# Patient Record
Sex: Male | Born: 1965 | Hispanic: Refuse to answer | Marital: Married | Smoking: Never smoker
Health system: Southern US, Community
[De-identification: ages and names within clinical notes are randomized; demographics above are authoritative.]

## PROBLEM LIST (undated history)

## (undated) DIAGNOSIS — Z87442 Personal history of urinary calculi: Secondary | ICD-10-CM

## (undated) DIAGNOSIS — M1990 Unspecified osteoarthritis, unspecified site: Secondary | ICD-10-CM

## (undated) DIAGNOSIS — K4090 Unilateral inguinal hernia, without obstruction or gangrene, not specified as recurrent: Secondary | ICD-10-CM

## (undated) DIAGNOSIS — L409 Psoriasis, unspecified: Secondary | ICD-10-CM

## (undated) DIAGNOSIS — S2249XA Multiple fractures of ribs, unspecified side, initial encounter for closed fracture: Secondary | ICD-10-CM

## (undated) DIAGNOSIS — S2239XA Fracture of one rib, unspecified side, initial encounter for closed fracture: Secondary | ICD-10-CM

## (undated) DIAGNOSIS — R112 Nausea with vomiting, unspecified: Secondary | ICD-10-CM

## (undated) DIAGNOSIS — K219 Gastro-esophageal reflux disease without esophagitis: Secondary | ICD-10-CM

## (undated) DIAGNOSIS — Z9889 Other specified postprocedural states: Secondary | ICD-10-CM

## (undated) DIAGNOSIS — J939 Pneumothorax, unspecified: Secondary | ICD-10-CM

## (undated) HISTORY — DX: Unspecified osteoarthritis, unspecified site: M19.90

## (undated) HISTORY — DX: Psoriasis, unspecified: L40.9

## (undated) HISTORY — PX: OTHER SURGICAL HISTORY: SHX169

## (undated) HISTORY — PX: APPENDECTOMY: SHX54

## (undated) HISTORY — PX: HERNIA REPAIR: SHX51

---

## 2003-12-11 ENCOUNTER — Emergency Department (HOSPITAL_COMMUNITY): Admission: EM | Admit: 2003-12-11 | Discharge: 2003-12-11 | Payer: Self-pay | Admitting: Emergency Medicine

## 2005-02-12 ENCOUNTER — Ambulatory Visit (HOSPITAL_BASED_OUTPATIENT_CLINIC_OR_DEPARTMENT_OTHER): Admission: RE | Admit: 2005-02-12 | Discharge: 2005-02-12 | Payer: Self-pay | Admitting: Orthopedic Surgery

## 2005-02-12 ENCOUNTER — Ambulatory Visit (HOSPITAL_COMMUNITY): Admission: RE | Admit: 2005-02-12 | Discharge: 2005-02-12 | Payer: Self-pay | Admitting: Orthopedic Surgery

## 2009-09-22 ENCOUNTER — Emergency Department (HOSPITAL_COMMUNITY): Admission: EM | Admit: 2009-09-22 | Discharge: 2009-09-23 | Payer: Self-pay | Admitting: Emergency Medicine

## 2009-09-29 ENCOUNTER — Emergency Department: Payer: Self-pay | Admitting: Emergency Medicine

## 2010-05-13 LAB — CBC
HCT: 46.5 % (ref 39.0–52.0)
Hemoglobin: 15.7 g/dL (ref 13.0–17.0)
MCH: 30.8 pg (ref 26.0–34.0)
MCHC: 33.8 g/dL (ref 30.0–36.0)
MCV: 91.3 fL (ref 78.0–100.0)
Platelets: 236 10*3/uL (ref 150–400)
RBC: 5.1 MIL/uL (ref 4.22–5.81)
RDW: 13 % (ref 11.5–15.5)
WBC: 16.1 10*3/uL — ABNORMAL HIGH (ref 4.0–10.5)

## 2010-05-13 LAB — POCT I-STAT, CHEM 8
BUN: 22 mg/dL (ref 6–23)
Calcium, Ion: 0.99 mmol/L — ABNORMAL LOW (ref 1.12–1.32)
Chloride: 109 mEq/L (ref 96–112)
Creatinine, Ser: 1.3 mg/dL (ref 0.4–1.5)
Glucose, Bld: 105 mg/dL — ABNORMAL HIGH (ref 70–99)
HCT: 47 % (ref 39.0–52.0)
Hemoglobin: 16 g/dL (ref 13.0–17.0)
Potassium: 3.7 mEq/L (ref 3.5–5.1)
Sodium: 143 mEq/L (ref 135–145)
TCO2: 23 mmol/L (ref 0–100)

## 2010-05-13 LAB — GLUCOSE, CAPILLARY: Glucose-Capillary: 103 mg/dL — ABNORMAL HIGH (ref 70–99)

## 2010-07-14 NOTE — Op Note (Signed)
NAMEFONTAINE, HEHL             ACCOUNT NO.:  [id number]   MEDICAL RECORD NO.:  [id number]          PATIENT TYPE:  AMB   LOCATION:  DSC                          FACILITY:  [facility]   PHYSICIAN:  Feliberto Gottron. Turner Daniels, M.D.   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   PROCEDURE:  Left coracoclavicular ligament reconstruction using a  semitendinosus graft loop through the clavicle and underneath the coracoid  process reinforced with two #5 FiberWires and a distal clavicle excision.   SURGEON:  Feliberto Gottron. Turner Daniels, M.D.   FIRST ASSISTANT:  None.   ANESTHETIC:  General endotracheal; interscalene block.   FLUID REPLACEMENT:  A liter of crystalloid.   DRAINS PLACED:  None.   TOURNIQUET TIME:  None.   ESTIMATED BLOOD LOSS:  150 mL   INDICATIONS FOR PROCEDURE:  [age]-year-old gentleman who fell off of  a horse 6 months ago and sustained a grade 3 AC separation, treated by  another physician appropriately and conservatively, who went on to develop  chronic pain and fatigue in his left shoulder.  Radiographs he brought to my  office were consistent with a chronic grade 3 AC separation and because of  pain and fatigue, he desired elective reconstruction on his second opinion.  This was discussed at length with him.  He is well aware of the risks and  benefits of surgery and all questions were answered.  We will attempt an  allograft reconstruction using a semitendinosus allograft which should be  about 4 times stronger than a traditional Weaver-Dunn procedure using the CA  ligament.   DESCRIPTION OF PROCEDURE:  The patient was identified by armband and taken  to the operating room at [facility].  Appropriate  anesthetic  monitors were attached and interscalene block anesthesia induced into the  left upper extremity.  This was followed by general endotracheal anesthesia.  He was then placed in the beach chair position and the left upper extremity  prepped and draped in the usual sterile fashion from the wrist to the  hemithorax.  We then made a saber-like incision over the [facility] joint following  Langer's lines from just posterior to the [facility] joint, over the [facility] joint and  then towards the coracoid process.  Small bleeders in the skin and  subcutaneous tissue were identified and cauterized.  We went down to the  fascia over the [facility] joint and clavicle and incised this longitudinally,  reflecting it anteriorly and posteriorly, exposing the distal clavicle and  the [facility] joint.  Once this had been completely exposed, we removed the distal  centimeter and half of the clavicle with the oscillating ACL saw and then  placed 2 quarter-inch holes in the clavicle just posterior to the midline  along the longitudinal axis from superior cortex through the inferior cortex  and these 2 tunnels were separated by about a  centimeter and a half of bone.  Satisfied with the clavicle preparation, we then smoothed off the edge of  the clavicle and directed our attention to passing a Cooley clamp underneath  the coracoid process and using this to pass a #2 Ethilon suture loop  underneath the coracoid.  Meanwhile on the back table, the semitendinosus  graft had been thawed and prepared with a #2 Ethibond whipstitch and then  using a suture passer, the #2 Ethibond whipstitch as well as two #5  FiberWires were brought from the inferior aspect of the distal clavicle hole  up through the superior aspect of that tunnel and then down through the  medial tunnel in the clavicle.  These loops were then passed underneath the  coracoid process, so in effect we had two #5 FiberWire suture loops and then  the allograft semitendinosus going up  through the clavicle, down through the  clavicle and underneath the coracoid process.  With an assistant applying  pressure on the elbow, the first #5 FiberWire loop was tied securely,  bringing the collar bone down against the posterior aspect of the coracoid  process, separated by only a couple of millimeters.  For reinforcement, a  second FiberWire loop was then tied and finally the semitendinosus tendon  loop, which was long enough, had 3 square knots placed in it, reinforced by  #1 Vicryl, and the excess semitendinosus tendon was then cut.  The wound was  then thoroughly irrigated out with normal saline solution.  The [facility] joint was  now reduced, fixed and reinforced.  We then repaired the deltotrapezius  fascia with #1 Vicryl, going over the clavicle and out over the acromion  process.  The subcutaneous tissue was then closed with 2-0 Vicryl suture and  the skin with running interlocking 3-0 nylon suture, a dressing of Xeroform,  4 x 4 dressing sponges, ABD and Hypafix tape applied followed by a sling.  The patient was laid supine, awakened and taken to the recovery room without  difficulty.      Feliberto Gottron. Turner Daniels, M.D.  Electronically Signed     FJR/MEDQ  D:  02/12/2005  T:  02/14/2005  Job:  [id number]

## 2011-03-28 ENCOUNTER — Ambulatory Visit (INDEPENDENT_AMBULATORY_CARE_PROVIDER_SITE_OTHER): Payer: BC Managed Care – PPO | Admitting: Ophthalmology

## 2011-03-28 DIAGNOSIS — H35419 Lattice degeneration of retina, unspecified eye: Secondary | ICD-10-CM

## 2011-03-28 DIAGNOSIS — H43819 Vitreous degeneration, unspecified eye: Secondary | ICD-10-CM

## 2011-03-28 DIAGNOSIS — H33309 Unspecified retinal break, unspecified eye: Secondary | ICD-10-CM

## 2011-04-06 ENCOUNTER — Encounter (INDEPENDENT_AMBULATORY_CARE_PROVIDER_SITE_OTHER): Payer: BC Managed Care – PPO | Admitting: Ophthalmology

## 2011-04-06 DIAGNOSIS — H33309 Unspecified retinal break, unspecified eye: Secondary | ICD-10-CM

## 2011-04-11 ENCOUNTER — Encounter (INDEPENDENT_AMBULATORY_CARE_PROVIDER_SITE_OTHER): Payer: BC Managed Care – PPO | Admitting: Ophthalmology

## 2011-04-11 DIAGNOSIS — H33309 Unspecified retinal break, unspecified eye: Secondary | ICD-10-CM

## 2011-04-26 ENCOUNTER — Ambulatory Visit (INDEPENDENT_AMBULATORY_CARE_PROVIDER_SITE_OTHER): Payer: BC Managed Care – PPO | Admitting: Ophthalmology

## 2011-04-26 DIAGNOSIS — H33309 Unspecified retinal break, unspecified eye: Secondary | ICD-10-CM

## 2011-06-05 ENCOUNTER — Other Ambulatory Visit: Payer: Self-pay | Admitting: Orthopedic Surgery

## 2011-06-14 ENCOUNTER — Encounter (HOSPITAL_BASED_OUTPATIENT_CLINIC_OR_DEPARTMENT_OTHER): Admission: RE | Payer: Self-pay | Source: Ambulatory Visit

## 2011-06-14 ENCOUNTER — Ambulatory Visit (HOSPITAL_BASED_OUTPATIENT_CLINIC_OR_DEPARTMENT_OTHER)
Admission: RE | Admit: 2011-06-14 | Payer: BC Managed Care – PPO | Source: Ambulatory Visit | Admitting: Orthopedic Surgery

## 2011-06-14 SURGERY — SHOULDER ACROMIOPLASTY
Anesthesia: General | Site: Shoulder | Laterality: Right

## 2011-08-24 ENCOUNTER — Ambulatory Visit (INDEPENDENT_AMBULATORY_CARE_PROVIDER_SITE_OTHER): Payer: BC Managed Care – PPO | Admitting: Ophthalmology

## 2011-12-20 ENCOUNTER — Other Ambulatory Visit: Payer: Self-pay | Admitting: Orthopaedic Surgery

## 2011-12-20 DIAGNOSIS — M549 Dorsalgia, unspecified: Secondary | ICD-10-CM

## 2011-12-21 ENCOUNTER — Other Ambulatory Visit: Payer: Self-pay | Admitting: Orthopaedic Surgery

## 2011-12-21 DIAGNOSIS — M549 Dorsalgia, unspecified: Secondary | ICD-10-CM

## 2011-12-26 ENCOUNTER — Ambulatory Visit
Admission: RE | Admit: 2011-12-26 | Discharge: 2011-12-26 | Disposition: A | Discharge status: Home / Self Care | Payer: BC Managed Care – PPO | Source: Ambulatory Visit | Attending: Orthopaedic Surgery | Admitting: Orthopaedic Surgery

## 2011-12-26 DIAGNOSIS — M549 Dorsalgia, unspecified: Secondary | ICD-10-CM

## 2013-06-04 ENCOUNTER — Ambulatory Visit: Payer: Self-pay | Admitting: Physician Assistant

## 2013-11-26 DIAGNOSIS — J939 Pneumothorax, unspecified: Secondary | ICD-10-CM

## 2013-11-26 HISTORY — DX: Pneumothorax, unspecified: J93.9

## 2013-12-04 ENCOUNTER — Ambulatory Visit: Payer: Self-pay | Admitting: Family Medicine

## 2013-12-04 LAB — COMPREHENSIVE METABOLIC PANEL
Albumin: 4.3 g/dL (ref 3.4–5.0)
Alkaline Phosphatase: 56 U/L
Anion Gap: 9 (ref 7–16)
BUN: 19 mg/dL — ABNORMAL HIGH (ref 7–18)
Bilirubin,Total: 0.4 mg/dL (ref 0.2–1.0)
Calcium, Total: 9 mg/dL (ref 8.5–10.1)
Chloride: 108 mmol/L — ABNORMAL HIGH (ref 98–107)
Co2: 27 mmol/L (ref 21–32)
Creatinine: 1.5 mg/dL — ABNORMAL HIGH (ref 0.60–1.30)
EGFR (African American): >60
EGFR (Non-African Amer.): 54 — ABNORMAL LOW
Glucose: 120 mg/dL — ABNORMAL HIGH (ref 65–99)
Osmolality: 290 (ref 275–301)
Potassium: 3.9 mmol/L (ref 3.5–5.1)
SGOT(AST): 119 U/L — ABNORMAL HIGH (ref 15–37)
SGPT (ALT): 126 U/L — ABNORMAL HIGH
Sodium: 144 mmol/L (ref 136–145)
Total Protein: 7.4 g/dL (ref 6.4–8.2)

## 2013-12-04 LAB — CBC WITH DIFFERENTIAL/PLATELET
Basophil #: 0.1 10*3/uL (ref 0.0–0.1)
Basophil %: 0.5 %
Eosinophil #: 0 10*3/uL (ref 0.0–0.7)
Eosinophil %: 0.3 %
HCT: 43.1 % (ref 40.0–52.0)
HGB: 14.2 g/dL (ref 13.0–18.0)
Lymphocyte #: 1.5 10*3/uL (ref 1.0–3.6)
Lymphocyte %: 11.7 %
MCH: 29.6 pg (ref 26.0–34.0)
MCHC: 32.9 g/dL (ref 32.0–36.0)
MCV: 90 fL (ref 80–100)
Monocyte #: 0.8 x10 3/mm (ref 0.2–1.0)
Monocyte %: 6.5 %
Neutrophil #: 10.1 10*3/uL — ABNORMAL HIGH (ref 1.4–6.5)
Neutrophil %: 81 %
Platelet: 294 10*3/uL (ref 150–440)
RBC: 4.79 10*6/uL (ref 4.40–5.90)
RDW: 13.4 % (ref 11.5–14.5)
WBC: 12.4 10*3/uL — ABNORMAL HIGH (ref 3.8–10.6)

## 2013-12-05 ENCOUNTER — Inpatient Hospital Stay: Payer: Self-pay | Admitting: Surgery

## 2013-12-08 LAB — PLATELET COUNT: Platelet: 224 10*3/uL (ref 150–440)

## 2013-12-17 ENCOUNTER — Ambulatory Visit: Payer: Self-pay | Admitting: Cardiothoracic Surgery

## 2013-12-27 ENCOUNTER — Ambulatory Visit: Payer: Self-pay | Admitting: Cardiothoracic Surgery

## 2014-02-24 ENCOUNTER — Ambulatory Visit: Payer: Self-pay | Admitting: Adult Health
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
## 2015-04-11 IMAGING — CR DG CHEST 1V PORT
1 series · 1 of 1 positions shown · non-contrast
Comparison: Chest x-ray 12/04/2013.

CLINICAL DATA: Chest tube placement.

EXAM:
PORTABLE CHEST - 1 VIEW

[ap]
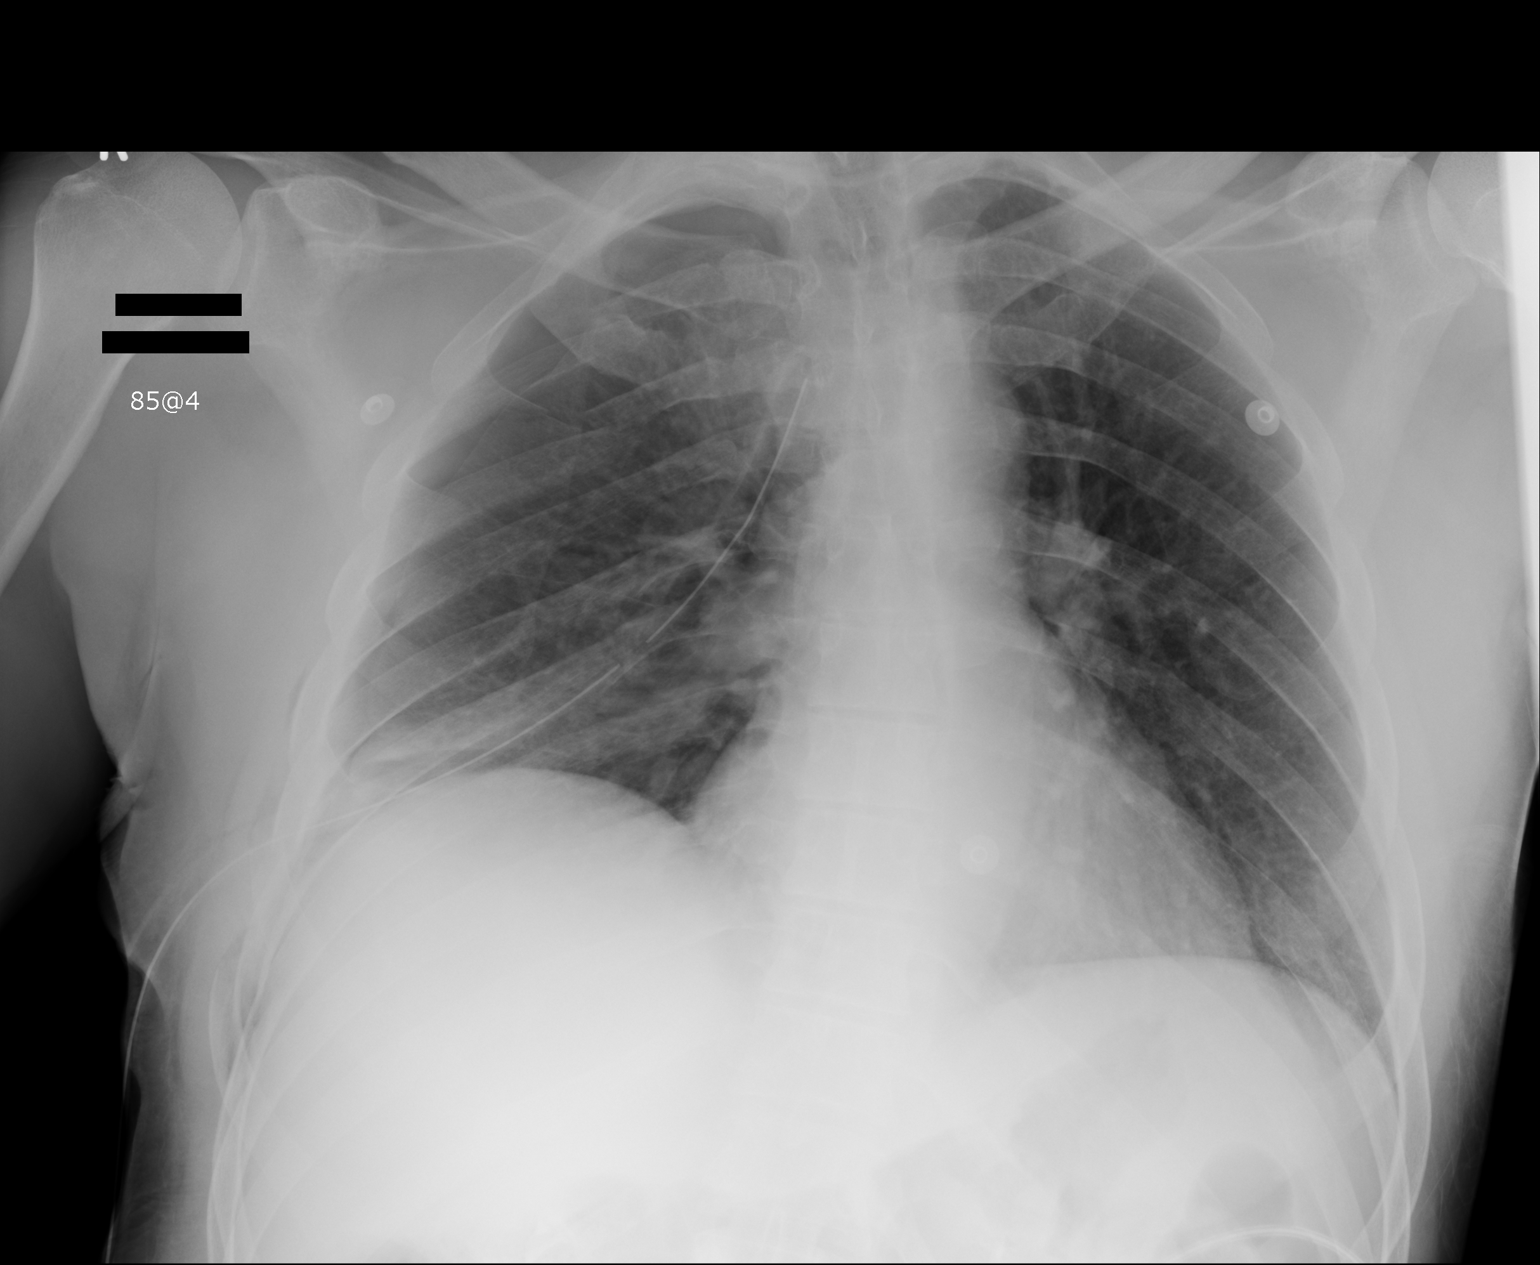

[1 of 1 positions shown; findings below may reference images not displayed]

FINDINGS: Right-sided chest tube in good anatomic position. Residual right
pneumothorax noted. Mild subcutaneous emphysema right chest wall.
Mediastinum an hilar structures are normal. Heart size normal. Mild
right base atelectasis. Multiple displaced rib fractures on the
right are noted.
IMPRESSION: 1. Right chest tube noted in good anatomic position. Residual right
pneumothorax noted.
2. Subsegmental atelectasis right lung base.
3. Displaced right rib fractures noted.

## 2015-04-12 IMAGING — CR DG CHEST 1V PORT
1 series · 1 of 1 positions shown · non-contrast
Comparison: 12/05/2013

CLINICAL DATA: Follow-up right pneumothorax

EXAM:
PORTABLE CHEST - 1 VIEW

[ap]
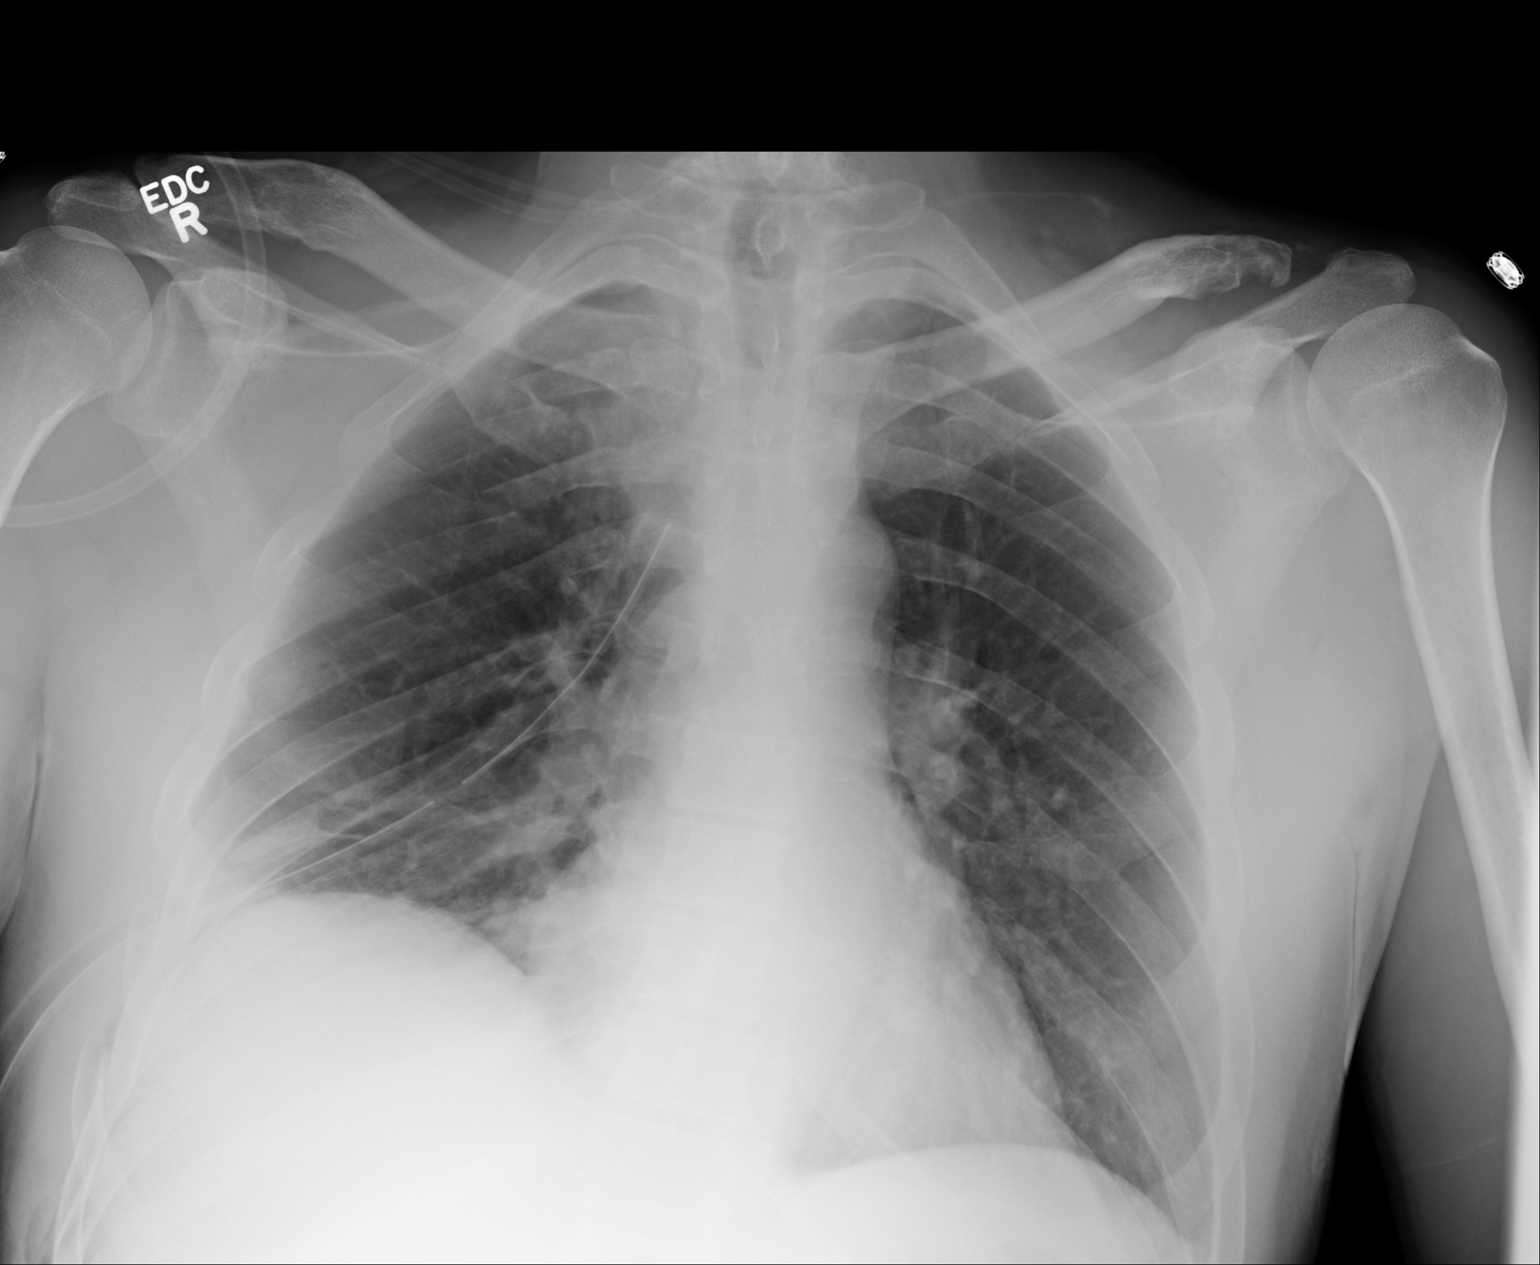

[1 of 1 positions shown; findings below may reference images not displayed]

FINDINGS: Cardiomediastinal silhouette is stable. Stable right chest tube
position. Stable right upper rib fractures. Improvement in right
pneumothorax. Only tiny residual right apical pneumothorax. No acute
infiltrate or pulmonary edema.
IMPRESSION: Stable right chest tube position. Only tiny residual right apical
pneumothorax. No acute infiltrate or pulmonary edema.

## 2015-11-24 ENCOUNTER — Encounter (INDEPENDENT_AMBULATORY_CARE_PROVIDER_SITE_OTHER): Payer: BLUE CROSS/BLUE SHIELD | Admitting: Ophthalmology

## 2015-11-24 DIAGNOSIS — H33303 Unspecified retinal break, bilateral: Secondary | ICD-10-CM | POA: Diagnosis not present

## 2015-11-24 DIAGNOSIS — H43813 Vitreous degeneration, bilateral: Secondary | ICD-10-CM | POA: Diagnosis not present

## 2016-06-25 ENCOUNTER — Other Ambulatory Visit: Payer: Self-pay | Admitting: Family Medicine

## 2016-06-25 DIAGNOSIS — R109 Unspecified abdominal pain: Secondary | ICD-10-CM

## 2016-06-28 ENCOUNTER — Ambulatory Visit
Admission: RE | Admit: 2016-06-28 | Discharge: 2016-06-28 | Disposition: A | Discharge status: Home / Self Care | Payer: BLUE CROSS/BLUE SHIELD | Source: Ambulatory Visit | Attending: Family Medicine | Admitting: Family Medicine

## 2016-06-28 DIAGNOSIS — N132 Hydronephrosis with renal and ureteral calculous obstruction: Secondary | ICD-10-CM | POA: Insufficient documentation

## 2016-06-28 DIAGNOSIS — R109 Unspecified abdominal pain: Secondary | ICD-10-CM | POA: Diagnosis present

## 2016-07-03 ENCOUNTER — Encounter (HOSPITAL_COMMUNITY): Payer: Self-pay | Admitting: *Deleted

## 2016-07-03 ENCOUNTER — Other Ambulatory Visit: Payer: Self-pay | Admitting: Urology

## 2016-07-04 ENCOUNTER — Ambulatory Visit (HOSPITAL_COMMUNITY): Payer: BLUE CROSS/BLUE SHIELD | Admitting: Certified Registered Nurse Anesthetist

## 2016-07-04 ENCOUNTER — Encounter (HOSPITAL_COMMUNITY)
Admission: RE | Disposition: A | Discharge status: Home / Self Care | Payer: Self-pay | Source: Ambulatory Visit | Attending: Urology

## 2016-07-04 ENCOUNTER — Ambulatory Visit (HOSPITAL_COMMUNITY)
Admission: RE | Admit: 2016-07-04 | Discharge: 2016-07-04 | Disposition: A | Discharge status: Home / Self Care | Payer: BLUE CROSS/BLUE SHIELD | Source: Ambulatory Visit | Attending: Urology | Admitting: Urology

## 2016-07-04 ENCOUNTER — Ambulatory Visit (HOSPITAL_COMMUNITY): Payer: BLUE CROSS/BLUE SHIELD

## 2016-07-04 ENCOUNTER — Encounter (HOSPITAL_COMMUNITY): Payer: Self-pay | Admitting: *Deleted

## 2016-07-04 DIAGNOSIS — Z87442 Personal history of urinary calculi: Secondary | ICD-10-CM | POA: Insufficient documentation

## 2016-07-04 DIAGNOSIS — N132 Hydronephrosis with renal and ureteral calculous obstruction: Secondary | ICD-10-CM | POA: Diagnosis present

## 2016-07-04 HISTORY — DX: Nausea with vomiting, unspecified: R11.2

## 2016-07-04 HISTORY — PX: CYSTOSCOPY WITH RETROGRADE PYELOGRAM, URETEROSCOPY AND STENT PLACEMENT: SHX5789

## 2016-07-04 HISTORY — DX: Pneumothorax, unspecified: J93.9

## 2016-07-04 HISTORY — DX: Personal history of urinary calculi: Z87.442

## 2016-07-04 HISTORY — DX: Other specified postprocedural states: Z98.890

## 2016-07-04 HISTORY — PX: HOLMIUM LASER APPLICATION: SHX5852

## 2016-07-04 LAB — BASIC METABOLIC PANEL
Anion gap: 10 (ref 5–15)
BUN: 22 mg/dL — ABNORMAL HIGH (ref 6–20)
CALCIUM: 8.7 mg/dL — AB (ref 8.9–10.3)
CO2: 27 mmol/L (ref 22–32)
CREATININE: 1.85 mg/dL — AB (ref 0.61–1.24)
Chloride: 102 mmol/L (ref 101–111)
GFR, EST AFRICAN AMERICAN: 48 mL/min — AB (ref >60)
GFR, EST NON AFRICAN AMERICAN: 41 mL/min — AB (ref >60)
GLUCOSE: 95 mg/dL (ref 65–99)
Potassium: 3.7 mmol/L (ref 3.5–5.1)
Sodium: 139 mmol/L (ref 135–145)

## 2016-07-04 LAB — CBC
HCT: 36.9 % — ABNORMAL LOW (ref 39.0–52.0)
Hemoglobin: 12.3 g/dL — ABNORMAL LOW (ref 13.0–17.0)
MCH: 29.4 pg (ref 26.0–34.0)
MCHC: 33.3 g/dL (ref 30.0–36.0)
MCV: 88.3 fL (ref 78.0–100.0)
PLATELETS: 350 10*3/uL (ref 150–400)
RBC: 4.18 MIL/uL — AB (ref 4.22–5.81)
RDW: 12.7 % (ref 11.5–15.5)
WBC: 13.2 10*3/uL — AB (ref 4.0–10.5)

## 2016-07-04 SURGERY — CYSTOURETEROSCOPY, WITH RETROGRADE PYELOGRAM AND STENT INSERTION
Anesthesia: General | Site: Ureter | Laterality: Right

## 2016-07-04 MED ORDER — FENTANYL CITRATE (PF) 100 MCG/2ML IJ SOLN
INTRAMUSCULAR | Status: AC
  Filled 2016-07-04: qty 2

## 2016-07-04 MED ORDER — PROPOFOL 10 MG/ML IV BOLUS
INTRAVENOUS | Status: AC
  Filled 2016-07-04: qty 20

## 2016-07-04 MED ORDER — LIDOCAINE 2% (20 MG/ML) 5 ML SYRINGE
INTRAMUSCULAR | Status: AC
  Filled 2016-07-04: qty 5

## 2016-07-04 MED ORDER — ONDANSETRON HCL 4 MG/2ML IJ SOLN
INTRAMUSCULAR | Status: DC | PRN
  Administered 2016-07-04: 4 mg via INTRAVENOUS

## 2016-07-04 MED ORDER — OXYCODONE HCL 5 MG PO TABS: 5.0000 mg | ORAL_TABLET | Freq: Once | ORAL | Status: DC | PRN

## 2016-07-04 MED ORDER — ROCURONIUM BROMIDE 50 MG/5ML IV SOSY
PREFILLED_SYRINGE | INTRAVENOUS | Status: DC | PRN
  Administered 2016-07-04: 30 mg via INTRAVENOUS

## 2016-07-04 MED ORDER — SUCCINYLCHOLINE CHLORIDE 200 MG/10ML IV SOSY
PREFILLED_SYRINGE | INTRAVENOUS | Status: DC | PRN
  Administered 2016-07-04: 140 mg via INTRAVENOUS

## 2016-07-04 MED ORDER — SUGAMMADEX SODIUM 200 MG/2ML IV SOLN
INTRAVENOUS | Status: DC | PRN
  Administered 2016-07-04: 200 mg via INTRAVENOUS

## 2016-07-04 MED ORDER — MIDAZOLAM HCL 2 MG/2ML IJ SOLN
INTRAMUSCULAR | Status: AC
  Filled 2016-07-04: qty 2

## 2016-07-04 MED ORDER — ACETAMINOPHEN 10 MG/ML IV SOLN
INTRAVENOUS | Status: AC
  Administered 2016-07-04: 1000 mg via INTRAVENOUS
  Filled 2016-07-04: qty 100

## 2016-07-04 MED ORDER — ROCURONIUM BROMIDE 50 MG/5ML IV SOSY
PREFILLED_SYRINGE | INTRAVENOUS | Status: AC
Start: 2016-07-04 — End: 2016-07-04
  Filled 2016-07-04: qty 5

## 2016-07-04 MED ORDER — SENNOSIDES-DOCUSATE SODIUM 8.6-50 MG PO TABS: 1.0000 | ORAL_TABLET | Freq: Two times a day (BID) | ORAL | 0 refills | Status: DC

## 2016-07-04 MED ORDER — DEXAMETHASONE SODIUM PHOSPHATE 10 MG/ML IJ SOLN
INTRAMUSCULAR | Status: DC | PRN
  Administered 2016-07-04: 10 mg via INTRAVENOUS

## 2016-07-04 MED ORDER — LACTATED RINGERS IV SOLN
INTRAVENOUS | Status: DC
  Administered 2016-07-04 (×2): via INTRAVENOUS

## 2016-07-04 MED ORDER — KETOROLAC TROMETHAMINE 10 MG PO TABS: 10.0000 mg | ORAL_TABLET | Freq: Four times a day (QID) | ORAL | 1 refills | Status: DC | PRN

## 2016-07-04 MED ORDER — SODIUM CHLORIDE 0.9 % IR SOLN
Status: DC | PRN
  Administered 2016-07-04: 3000 mL

## 2016-07-04 MED ORDER — DEXAMETHASONE SODIUM PHOSPHATE 10 MG/ML IJ SOLN
INTRAMUSCULAR | Status: AC
  Filled 2016-07-04: qty 1

## 2016-07-04 MED ORDER — SULFAMETHOXAZOLE-TRIMETHOPRIM 800-160 MG PO TABS: 1.0000 | ORAL_TABLET | Freq: Two times a day (BID) | ORAL | 0 refills | Status: DC

## 2016-07-04 MED ORDER — 0.9 % SODIUM CHLORIDE (POUR BTL) OPTIME
TOPICAL | Status: DC | PRN
  Administered 2016-07-04: 1000 mL

## 2016-07-04 MED ORDER — SODIUM CHLORIDE 0.9 % IR SOLN
Status: DC | PRN
  Administered 2016-07-04: 2000 mL

## 2016-07-04 MED ORDER — MEPERIDINE HCL 50 MG/ML IJ SOLN
6.2500 mg | INTRAMUSCULAR | Status: DC | PRN
  Administered 2016-07-04: 12.5 mg via INTRAVENOUS

## 2016-07-04 MED ORDER — IOHEXOL 300 MG/ML  SOLN
INTRAMUSCULAR | Status: DC | PRN
Start: 2016-07-04 — End: 2016-07-04
  Administered 2016-07-04: 14 mL

## 2016-07-04 MED ORDER — HYDROMORPHONE HCL 1 MG/ML IJ SOLN: 0.2500 mg | INTRAMUSCULAR | Status: DC | PRN

## 2016-07-04 MED ORDER — FENTANYL CITRATE (PF) 100 MCG/2ML IJ SOLN
INTRAMUSCULAR | Status: DC | PRN
  Administered 2016-07-04 (×4): 50 ug via INTRAVENOUS

## 2016-07-04 MED ORDER — PROPOFOL 10 MG/ML IV BOLUS
INTRAVENOUS | Status: DC | PRN
  Administered 2016-07-04: 200 mg via INTRAVENOUS

## 2016-07-04 MED ORDER — OXYCODONE HCL 5 MG/5ML PO SOLN: 5.0000 mg | Freq: Once | ORAL | Status: DC | PRN

## 2016-07-04 MED ORDER — CEFAZOLIN SODIUM-DEXTROSE 2-4 GM/100ML-% IV SOLN
2.0000 g | INTRAVENOUS | Status: AC
  Administered 2016-07-04: 2 g via INTRAVENOUS
  Filled 2016-07-04: qty 100

## 2016-07-04 MED ORDER — ACETAMINOPHEN 10 MG/ML IV SOLN
1000.0000 mg | Freq: Once | INTRAVENOUS | Status: AC
  Administered 2016-07-04: 1000 mg via INTRAVENOUS

## 2016-07-04 MED ORDER — HYDROCODONE-ACETAMINOPHEN 5-325 MG PO TABS: 1.0000 | ORAL_TABLET | Freq: Four times a day (QID) | ORAL | 0 refills | Status: DC | PRN

## 2016-07-04 MED ORDER — LIDOCAINE 2% (20 MG/ML) 5 ML SYRINGE
INTRAMUSCULAR | Status: DC | PRN
  Administered 2016-07-04: 100 mg via INTRAVENOUS

## 2016-07-04 MED ORDER — SUCCINYLCHOLINE CHLORIDE 200 MG/10ML IV SOSY
PREFILLED_SYRINGE | INTRAVENOUS | Status: AC
  Filled 2016-07-04: qty 10

## 2016-07-04 MED ORDER — MEPERIDINE HCL 50 MG/ML IJ SOLN
INTRAMUSCULAR | Status: AC
  Administered 2016-07-04: 12.5 mg via INTRAVENOUS
  Filled 2016-07-04: qty 1

## 2016-07-04 MED ORDER — PROMETHAZINE HCL 25 MG/ML IJ SOLN: 6.2500 mg | INTRAMUSCULAR | Status: DC | PRN

## 2016-07-04 MED ORDER — ONDANSETRON HCL 4 MG/2ML IJ SOLN
INTRAMUSCULAR | Status: AC
  Filled 2016-07-04: qty 2

## 2016-07-04 SURGICAL SUPPLY — 30 items
BAG URO CATCHER STRL LF (MISCELLANEOUS) ×4 IMPLANT
BASKET LASER NITINOL 1.9FR (BASKET) IMPLANT
BASKET ZERO TIP 1.9FR (BASKET) ×4 IMPLANT
CATH INTERMIT  6FR 70CM (CATHETERS) ×4 IMPLANT
CLOTH BEACON ORANGE TIMEOUT ST (SAFETY) ×4 IMPLANT
COVER SURGICAL LIGHT HANDLE (MISCELLANEOUS) IMPLANT
FIBER LASER FLEXIVA 1000 (UROLOGICAL SUPPLIES) IMPLANT
FIBER LASER FLEXIVA 365 (UROLOGICAL SUPPLIES) IMPLANT
FIBER LASER FLEXIVA 550 (UROLOGICAL SUPPLIES) IMPLANT
FIBER LASER TRAC TIP (UROLOGICAL SUPPLIES) ×4 IMPLANT
GLOVE BIOGEL M STRL SZ7.5 (GLOVE) ×4 IMPLANT
GLOVE BIOGEL PI IND STRL 7.0 (GLOVE) ×2 IMPLANT
GLOVE BIOGEL PI INDICATOR 7.0 (GLOVE) ×2
GLOVE SURG SS PI 6.5 STRL IVOR (GLOVE) ×4 IMPLANT
GOWN STRL REUS W/TWL LRG LVL3 (GOWN DISPOSABLE) ×8 IMPLANT
GUIDEWIRE ANG ZIPWIRE 038X150 (WIRE) ×4 IMPLANT
GUIDEWIRE STR DUAL SENSOR (WIRE) ×4 IMPLANT
IV NS 1000ML (IV SOLUTION) ×4
IV NS 1000ML BAXH (IV SOLUTION) ×4 IMPLANT
IV NS IRRIG 3000ML ARTHROMATIC (IV SOLUTION) ×4 IMPLANT
MANIFOLD NEPTUNE II (INSTRUMENTS) ×4 IMPLANT
PACK CYSTO (CUSTOM PROCEDURE TRAY) ×4 IMPLANT
SHEATH ACCESS URETERAL 24CM (SHEATH) IMPLANT
SHEATH ACCESS URETERAL 38CM (SHEATH) ×4 IMPLANT
SHEATH ACCESS URETERAL 54CM (SHEATH) IMPLANT
STENT POLARIS LOOP 6FR X 24 CM (STENTS) ×4 IMPLANT
SYR CONTROL 10ML LL (SYRINGE) IMPLANT
TUBE FEEDING 8FR 16IN STR KANG (MISCELLANEOUS) ×4 IMPLANT
TUBING CONNECTING 10 (TUBING) ×3 IMPLANT
TUBING CONNECTING 10' (TUBING) ×1

## 2016-07-04 NOTE — Brief Op Note (Signed)
07/04/2016  5:28 PM  PATIENT:  Bryce Medina  51 y.o. male  PRE-OPERATIVE DIAGNOSIS:  RIGHT URETERAL / RENAL STONESRIGHT URETERAL / RENAL STONES  POST-OPERATIVE DIAGNOSIS:  RIGHT URETERAL / RENAL STONES  PROCEDURE:  Procedure(s): CYSTOSCOPY WITH RETROGRADE PYELOGRAM, URETEROSCOPY AND STENT PLACEMENT (N/A) HOLMIUM LASER APPLICATION (Right)  SURGEON:  Surgeon(s) and Role:    Alexis Frock, MD - Primary  PHYSICIAN ASSISTANT:   ASSISTANTS: none   ANESTHESIA:   general  EBL:  Total I/O In: 1000 [I.V.:1000] Out: -   BLOOD ADMINISTERED:none  DRAINS: none   LOCAL MEDICATIONS USED:  NONE  SPECIMEN:  Source of Specimen:  Rt ureteral / renal stones  DISPOSITION OF SPECIMEN:  [facility] for compositional analysis  COUNTS:  YES  TOURNIQUET:  * No tourniquets in log *  DICTATION: .Other Dictation: Dictation Number [id number]  PLAN OF CARE: Discharge to home after PACU  PATIENT DISPOSITION:  PACU - hemodynamically stable.   Delay start of Pharmacological VTE agent (>24hrs) due to surgical blood loss or risk of bleeding: yes

## 2016-07-04 NOTE — H&P (Signed)
Bryce Medina is an 51 y.o. male.    Chief Complaint: Pre-op RIGHT Ureteroscopic Stone Manipulation  HPI:   1 - Recurrent Nephrolithiasis -  Pre 2018 - medical passage x "lost count"  06/2016 - Rt 3mm mid ureteral stone (L4-5 interspace, 900 HU, SSD 11cm) with mild hydro + 37mm Rt intrarenal stone x 4 and Lt 56mm intrarenal stone x 2 by CT on eval flank pain. UA without infectious parameters.   PMH sig for Lt inguinal hernia repair / mesh, appy, ortho surgery. NO CV disease / blood thinners. His PCP is Bryce Medina with Bryce Medina.   Today " Bryce Medina" is seen to proceed with RIGHT ureteroscopic stone manipulation with goal of right side stone free. Most recent UA without infectious parameters. He has had a mild cough and congestion but no dysuria.    Past Medical History:  Diagnosis Date  . History of kidney stones   . PONV (postoperative nausea and vomiting)     Past Surgical History:  Procedure Laterality Date  . APPENDECTOMY    . HERNIA REPAIR     left inguinla hernia repair   . left shoulder surgery       History reviewed. No pertinent family history. Social History:  reports that he has never smoked. He has never used smokeless tobacco. He reports that he does not drink alcohol or use drugs.  Allergies: Not on File  No prescriptions prior to admission.    No results found for this or any previous visit (from the past 48 hour(s)). No results found.  Review of Systems  Constitutional: Negative.  Negative for chills and fever.  HENT: Negative.   Eyes: Negative.   Respiratory: Negative.   Cardiovascular: Negative.   Gastrointestinal: Negative.   Genitourinary: Positive for flank pain.  Skin: Negative.   Neurological: Negative.   Endo/Heme/Allergies: Negative.   Psychiatric/Behavioral: Negative.     There were no vitals taken for this visit. Physical Exam  Constitutional: He appears well-developed.  HENT:  Head: Normocephalic.  Eyes: Pupils are equal, round, and  reactive to light.  Neck: Normal range of motion.  Cardiovascular: Normal rate.   Respiratory: Effort normal.  GI: Soft.  Genitourinary:  Genitourinary Comments: Mild Rt CVAT at present  Musculoskeletal: Normal range of motion.  Neurological: He is alert.  Skin: Skin is warm.     Assessment/Plan  Proceed as planned with RIGHT Ureteroscopic stone manipulation. Risks, benefits, alternatives, expected peri-op course, need for temporary peri-op stent discussed previously and reiterated today.   Alexis Frock, MD 07/04/2016, 7:19 AM

## 2016-07-04 NOTE — Anesthesia Postprocedure Evaluation (Signed)
Anesthesia Post Note  Patient: Bryce Medina  Procedure(s) Performed: Procedure(s) (LRB): CYSTOSCOPY WITH RETROGRADE PYELOGRAM, URETEROSCOPY AND STENT PLACEMENT (N/A) HOLMIUM LASER APPLICATION (Right)  Patient location during evaluation: PACU Anesthesia Type: General Level of consciousness: awake and alert Pain management: pain level controlled Vital Signs Assessment: post-procedure vital signs reviewed and stable Respiratory status: spontaneous breathing, nonlabored ventilation and respiratory function stable Cardiovascular status: blood pressure returned to baseline and stable Postop Assessment: no signs of nausea or vomiting Anesthetic complications: no       Last Vitals:  Vitals:   07/04/16 1815 07/04/16 1822  BP: (!) 149/93 130/84  Pulse: 98 97  Resp: 17 16  Temp: 37.6 C 37.6 C    Last Pain:  Vitals:   07/04/16 1815  TempSrc:   PainSc: 0-No pain                 Lynda Rainwater

## 2016-07-04 NOTE — Progress Notes (Signed)
Patient has questions about surgical consent that RN cannot answer. He wants to know if renal stones are to be removed today. He wants to speak to Dr Tresa Moore prior to signing consent.

## 2016-07-04 NOTE — Transfer of Care (Signed)
Immediate Anesthesia Transfer of Care Note  Patient: Bryce Medina  Procedure(s) Performed: Procedure(s): CYSTOSCOPY WITH RETROGRADE PYELOGRAM, URETEROSCOPY AND STENT PLACEMENT (N/A) HOLMIUM LASER APPLICATION (Right)  Patient Location: PACU  Anesthesia Type:General  Level of Consciousness:  sedated, patient cooperative and responds to stimulation  Airway & Oxygen Therapy:Patient Spontanous Breathing and Patient connected to face mask oxgen  Post-op Assessment:  Report given to PACU RN and Post -op Vital signs reviewed and stable  Post vital signs:  Reviewed and stable  Last Vitals:  Vitals:   07/04/16 1745 07/04/16 1746  BP: (!) 170/102   Pulse:  (!) 116  Resp:    Temp:  94.4 C    Complications: No apparent anesthesia complications

## 2016-07-04 NOTE — Anesthesia Procedure Notes (Signed)
Procedure Name: Intubation Date/Time: 07/04/2016 4:36 PM Performed by: West Pugh Pre-anesthesia Checklist: Patient identified, Emergency Drugs available, Suction available, Patient being monitored and Timeout performed Patient Re-evaluated:Patient Re-evaluated prior to inductionOxygen Delivery Method: Circle system utilized Preoxygenation: Pre-oxygenation with 100% oxygen Intubation Type: IV induction Ventilation: Mask ventilation without difficulty Laryngoscope Size: Mac and 4 Grade View: Grade I Tube type: Oral Tube size: 7.5 mm Number of attempts: 1 Airway Equipment and Method: Stylet Placement Confirmation: ETT inserted through vocal cords under direct vision,  positive ETCO2,  CO2 detector and breath sounds checked- equal and bilateral Secured at: 22 cm Tube secured with: Tape Dental Injury: Teeth and Oropharynx as per pre-operative assessment

## 2016-07-04 NOTE — Discharge Instructions (Signed)
1 - You may have urinary urgency (bladder spasms) and bloody urine on / off with stent in place. This is normal. ° °2 - Call MD or go to ER for fever >102, severe pain / nausea / vomiting not relieved by medications, or acute change in medical status ° °

## 2016-07-04 NOTE — Anesthesia Preprocedure Evaluation (Signed)
Anesthesia Evaluation  Patient identified by MRN, date of birth, ID band Patient awake    Reviewed: Allergy & Precautions, NPO status , Patient's Chart, lab work & pertinent test results  History of Anesthesia Complications (+) PONV  Airway Mallampati: II  TM Distance: >3 FB Neck ROM: Full    Dental no notable dental hx.    Pulmonary neg pulmonary ROS,    Pulmonary exam normal breath sounds clear to auscultation       Cardiovascular negative cardio ROS Normal cardiovascular exam Rhythm:Regular Rate:Normal     Neuro/Psych negative neurological ROS  negative psych ROS   GI/Hepatic negative GI ROS, Neg liver ROS,   Endo/Other  negative endocrine ROS  Renal/GU negative Renal ROS  negative genitourinary   Musculoskeletal negative musculoskeletal ROS (+)   Abdominal   Peds negative pediatric ROS (+)  Hematology negative hematology ROS (+)   Anesthesia Other Findings   Reproductive/Obstetrics negative OB ROS                             Anesthesia Physical Anesthesia Plan  ASA: II  Anesthesia Plan: General   Post-op Pain Management:    Induction: Intravenous  Airway Management Planned:   Additional Equipment:   Intra-op Plan:   Post-operative Plan:   Informed Consent: I have reviewed the patients History and Physical, chart, labs and discussed the procedure including the risks, benefits and alternatives for the proposed anesthesia with the patient or authorized representative who has indicated his/her understanding and acceptance.   Dental advisory given  Plan Discussed with: CRNA  Anesthesia Plan Comments:         Anesthesia Quick Evaluation

## 2016-07-04 NOTE — Interval H&P Note (Signed)
History and Physical Interval Note:  07/04/2016 4:18 PM  [name]  has presented today for surgery, with the diagnosis of RIGHT URETERAL / RENAL STONES  The various methods of treatment have been discussed with the patient and family. After consideration of risks, benefits and other options for treatment, the patient has consented to  Procedure(s): CYSTOSCOPY WITH RETROGRADE PYELOGRAM, URETEROSCOPY AND STENT PLACEMENT (Right) HOLMIUM LASER APPLICATION (Right) as a surgical intervention .  The patient's history has been reviewed, patient examined, no change in status, stable for surgery.  I have reviewed the patient's chart and labs.  Questions were answered to the patient's satisfaction.     Lacrecia Delval

## 2016-07-05 NOTE — Op Note (Signed)
NAME:  Bryce Medina, Bryce Medina                  ACCOUNT NO.:  MEDICAL RECORD NO.:  [id number]  LOCATION:                                 FACILITY:  PHYSICIAN:  Alexis Frock, MD          DATE OF BIRTH:  DATE OF PROCEDURE: 07/04/2016                              OPERATIVE REPORT   DIAGNOSIS:  Right ureteral and renal stones with refractory colic.  PROCEDURES: 1. Cystoscopy with right retrograde pyelogram and interpretation. 2. Right ureteroscopy with laser lithotripsy. 3. Insertion of right ureteral stent, 6 x 24 Polaris, no tether.  ESTIMATED BLOOD LOSS:  Nil.  COMPLICATION:  None.  SPECIMENS:  Right ureteral and renal stone fragments for compositional analysis.  FINDINGS: 1. Impacted right mid ureteral stone with mild hydroureteronephrosis     above this. 2. Multifocal right intrarenal stones. 3. Complete resolution of all stone fragments larger than 1/3rd mm     following laser lithotripsy and basket extraction in the right     kidney and ureter. 4. Successful placement of right ureteral stent, proximal in renal     pelvis and distal in urinary bladder.  INDICATION:  Bryce Medina is a pleasant 51 year old gentleman with history of recurrent nephrolithiasis.  He was found on workup of colicky flank pain to have a 6-mm right mid ureteral stone as well as multifocal right intrarenal stones and small volume left intrarenal stones. Options were discussed for management including medical therapy versus shockwave lithotripsy versus ureteroscopy, and he wished to proceed with the latter with goal of right-sided stone free.  Informed consent was obtained and placed in the medical record.  PROCEDURE IN DETAIL:  The patient being Bryce Medina, was verified. Procedure being right ureteroscopic stone manipulation was confirmed. Procedure was carried out.  Time-out was performed.  Intravenous antibiotics were administered.  General anesthesia was introduced.  The patient was placed  into a low lithotomy position and sterile field was created by prepping and draping the patient's penis, perineum and proximal thighs using iodine.  Next, cystourethroscopy was performed using a rigid cystoscope with offset lens.  Inspection of the anterior and posterior urethra unremarkable.  Inspection of the urinary bladder revealed no diverticula, calcifications, papillary lesions.  Ureteral orifices appeared singleton bilaterally.  The right ureteral orifice was cannulated with a 6-French end-hole catheter and right retrograde pyelogram was obtained.  Right retrograde pyelogram demonstrated a single right ureter with single-system right kidney.  There was mild hydroureteronephrosis to mobile filling defect in the midureter consistent with known stone.  A 0.038 Zip wire was advanced to the level of the pole, set aside as a safety wire.  An 8-French feeding tube placed in the urinary bladder for pressure release and semi-rigid ureteroscopy was performed to the distal right ureter alongside a separate Sensor working wire.  As expected, the midureteral stone was encountered.  It was appeared to be much too large for simple basketing.  Holmium laser energy then applied to the stone using settings of 0.2 joules and 20 Hz, which fragmenting approximately three smaller pieces, which were then sequentially grasped on their long axis and brought to the level of the urinary bladder.  Semi-rigid ureteroscopy of the remainder of the distal four-fifths of the right ureter revealed no additional mucosal abnormalities.  The semi-rigid scope was then exchanged for 12/14, 38-cm ureteral access sheath at the level of the proximal ureter using continuous fluoroscopic guidance and flexible digital ureteroscopy was performed to the proximal right ureter, systematic inspection of the right kidney including all calices x3.  There were multifocal intrarenal stones mostly papillary tip and essentially all  aspects of the kidney.  Majority of these were amenable to simple basketing and an Escape basket was used to grasp these and they were removed in their entirety, set aside for compositional analysis.  One of the lower pole stones was too large for simple basketing was repositioned in the upper pole where Holmium laser energy applied to the stone fragmenting into two smaller pieces, these were then sequentially removed.  Following these maneuvers, there was excellent hemostasis, complete resolution of all stone fragments larger than 1/3rd mm.  The access sheath was removed under continuous vision and there was just some mucosal swelling in the area of prior site of stone impaction as expected.  Given the large volume stones and the impacted nature of the stone, it was felt that interval stenting without tether would be warranted.  As such, a new 6 x 24 Polaris-type stent was placed using cystoscopic and fluoroscopic guidance, good proximal and distal deployment were noted.  The small stone fragment was irrigated from the bladder, all set aside for compositional analysis and procedure was terminated.  The patient tolerated the procedure well.  There were no immediate periprocedural complications.  The patient was taken to the postanesthesia care unit in stable condition.          ______________________________ Alexis Frock, MD     TM/MEDQ  D:  07/04/2016  T:  07/05/2016  Job:  [id number]

## 2016-07-06 ENCOUNTER — Encounter (HOSPITAL_COMMUNITY): Payer: Self-pay | Admitting: Urology

## 2016-11-01 ENCOUNTER — Other Ambulatory Visit: Payer: Self-pay | Admitting: Family Medicine

## 2016-11-01 ENCOUNTER — Other Ambulatory Visit (HOSPITAL_COMMUNITY): Payer: Self-pay | Admitting: Family Medicine

## 2016-11-01 DIAGNOSIS — M5442 Lumbago with sciatica, left side: Principal | ICD-10-CM

## 2016-11-01 DIAGNOSIS — M5441 Lumbago with sciatica, right side: Secondary | ICD-10-CM

## 2016-11-05 ENCOUNTER — Other Ambulatory Visit: Payer: Self-pay | Admitting: Family Medicine

## 2016-11-05 DIAGNOSIS — M5442 Lumbago with sciatica, left side: Principal | ICD-10-CM

## 2016-11-05 DIAGNOSIS — M5441 Lumbago with sciatica, right side: Secondary | ICD-10-CM

## 2016-11-07 ENCOUNTER — Ambulatory Visit (HOSPITAL_COMMUNITY): Payer: BLUE CROSS/BLUE SHIELD

## 2016-11-07 ENCOUNTER — Ambulatory Visit (HOSPITAL_COMMUNITY)
Admission: RE | Admit: 2016-11-07 | Discharge: 2016-11-07 | Disposition: A | Discharge status: Home / Self Care | Payer: BLUE CROSS/BLUE SHIELD | Source: Ambulatory Visit | Attending: Family Medicine | Admitting: Family Medicine

## 2016-11-07 ENCOUNTER — Encounter (HOSPITAL_COMMUNITY): Payer: Self-pay

## 2016-11-07 DIAGNOSIS — M48061 Spinal stenosis, lumbar region without neurogenic claudication: Secondary | ICD-10-CM | POA: Diagnosis not present

## 2016-11-07 DIAGNOSIS — M5442 Lumbago with sciatica, left side: Secondary | ICD-10-CM | POA: Insufficient documentation

## 2016-11-07 DIAGNOSIS — M47816 Spondylosis without myelopathy or radiculopathy, lumbar region: Secondary | ICD-10-CM | POA: Diagnosis not present

## 2016-11-07 DIAGNOSIS — M5441 Lumbago with sciatica, right side: Secondary | ICD-10-CM

## 2016-11-09 ENCOUNTER — Ambulatory Visit: Payer: BLUE CROSS/BLUE SHIELD

## 2016-11-21 ENCOUNTER — Ambulatory Visit: Payer: BLUE CROSS/BLUE SHIELD | Admitting: Student in an Organized Health Care Education/Training Program

## 2016-11-26 ENCOUNTER — Ambulatory Visit (INDEPENDENT_AMBULATORY_CARE_PROVIDER_SITE_OTHER): Payer: BLUE CROSS/BLUE SHIELD | Admitting: Ophthalmology

## 2016-11-26 DIAGNOSIS — H33303 Unspecified retinal break, bilateral: Secondary | ICD-10-CM

## 2016-11-26 DIAGNOSIS — H43813 Vitreous degeneration, bilateral: Secondary | ICD-10-CM | POA: Diagnosis not present

## 2016-11-26 DIAGNOSIS — H2512 Age-related nuclear cataract, left eye: Secondary | ICD-10-CM | POA: Diagnosis not present

## 2016-12-05 ENCOUNTER — Ambulatory Visit
Admission: RE | Admit: 2016-12-05 | Discharge: 2016-12-05 | Disposition: A | Discharge status: Home / Self Care | Payer: BLUE CROSS/BLUE SHIELD | Source: Ambulatory Visit | Attending: Student in an Organized Health Care Education/Training Program | Admitting: Student in an Organized Health Care Education/Training Program

## 2016-12-05 ENCOUNTER — Encounter: Payer: Self-pay | Admitting: Student in an Organized Health Care Education/Training Program

## 2016-12-05 ENCOUNTER — Ambulatory Visit (HOSPITAL_BASED_OUTPATIENT_CLINIC_OR_DEPARTMENT_OTHER): Payer: BLUE CROSS/BLUE SHIELD | Admitting: Student in an Organized Health Care Education/Training Program

## 2016-12-05 VITALS — BP 160/107 | HR 91 | Temp 98.4°F | Resp 14 | Ht 71.0 in | Wt 180.0 lb

## 2016-12-05 DIAGNOSIS — M47816 Spondylosis without myelopathy or radiculopathy, lumbar region: Secondary | ICD-10-CM

## 2016-12-05 DIAGNOSIS — M48061 Spinal stenosis, lumbar region without neurogenic claudication: Secondary | ICD-10-CM | POA: Insufficient documentation

## 2016-12-05 DIAGNOSIS — G8929 Other chronic pain: Secondary | ICD-10-CM | POA: Insufficient documentation

## 2016-12-05 DIAGNOSIS — M7138 Other bursal cyst, other site: Secondary | ICD-10-CM | POA: Diagnosis not present

## 2016-12-05 DIAGNOSIS — M79604 Pain in right leg: Secondary | ICD-10-CM | POA: Insufficient documentation

## 2016-12-05 DIAGNOSIS — M5416 Radiculopathy, lumbar region: Secondary | ICD-10-CM

## 2016-12-05 DIAGNOSIS — M545 Low back pain: Secondary | ICD-10-CM | POA: Insufficient documentation

## 2016-12-05 DIAGNOSIS — M4726 Other spondylosis with radiculopathy, lumbar region: Secondary | ICD-10-CM | POA: Insufficient documentation

## 2016-12-05 DIAGNOSIS — M79605 Pain in left leg: Secondary | ICD-10-CM | POA: Diagnosis present

## 2016-12-05 DIAGNOSIS — Z87442 Personal history of urinary calculi: Secondary | ICD-10-CM | POA: Insufficient documentation

## 2016-12-05 MED ORDER — SODIUM CHLORIDE 0.9% FLUSH
2.0000 mL | Freq: Once | INTRAVENOUS | Status: AC
  Administered 2016-12-05: 2 mL

## 2016-12-05 MED ORDER — ROPIVACAINE HCL 2 MG/ML IJ SOLN
2.0000 mL | Freq: Once | INTRAMUSCULAR | Status: AC
  Administered 2016-12-05: 2 mL via EPIDURAL
  Filled 2016-12-05: qty 10

## 2016-12-05 MED ORDER — IOPAMIDOL (ISOVUE-M 200) INJECTION 41%
10.0000 mL | Freq: Once | INTRAMUSCULAR | Status: AC
  Administered 2016-12-05: 10 mL via EPIDURAL
  Filled 2016-12-05: qty 10

## 2016-12-05 MED ORDER — DEXAMETHASONE SODIUM PHOSPHATE 10 MG/ML IJ SOLN
10.0000 mg | Freq: Once | INTRAMUSCULAR | Status: AC
  Administered 2016-12-05: 10 mg
  Filled 2016-12-05: qty 1

## 2016-12-05 MED ORDER — LIDOCAINE HCL (PF) 1 % IJ SOLN
10.0000 mL | Freq: Once | INTRAMUSCULAR | Status: AC
  Administered 2016-12-05: 10 mL
  Filled 2016-12-05: qty 10

## 2016-12-05 NOTE — Progress Notes (Signed)
Patient's Name: STEFFON GLADU  MRN: [id number]  Referring Provider: Meade Maw, MD  DOB: [birth year]  PCP: Derinda Late, MD  DOS: 12/05/2016  Note by: Gillis Santa, MD  Service setting: Ambulatory outpatient  Specialty: Interventional Pain Management  Location: [facility] (AMB) Pain Management Facility    Patient type: New patient ("FAST-TRACK" Evaluation)   Warning: This referral option does not include the extensive pharmacological evaluation required for [place] to take over the patient's medication management. The "Fast-Track" system is designed to bypass the new patient referral waiting list, as well as the normal patient evaluation process, in order to provide a patient in distress with a timely pain management intervention. Because the system was not designed to unfairly get a patient into our pain practice ahead of those already waiting, certain restrictions apply. By requesting a "Fast-Track" consult, the referring physician has opted to continue managing the patient's medications in order to get interventional urgent care.  Primary Reason for Visit: Interventional Pain Management Treatment. CC: Back Pain (low and to bilateral buttocks) and Leg Pain (bilateral)   Procedure  HPI  Mr. Montelongo is a 51 y.o. year old, male patient, who comes today for a  "Fast-Track" new patient evaluation, as requested by Meade Maw, MD. The patient has been made aware that this type of referral option is reserved for the Interventional Pain Management portion of our practice and completely excludes the option of medication management. His primarily concern today is the Back Pain (low and to bilateral buttocks) and Leg Pain (bilateral)  Pain Assessment: Location: Right, Left, Lower Back Radiating: bilateral legs Onset: 1 to 4 weeks ago Duration: Acute pain Quality: Spasm, Shooting, Sharp, Radiating ("like being tazed") Severity: 8 /10 (self-reported pain score)  Note: Reported level is  compatible with observation.                   When using our objective Pain Scale, levels between 6 and 10/10 are said to belong in an emergency room, as it progressively worsens from a 6/10, described as severely limiting, requiring emergency care not usually available at an outpatient pain management facility. At a 6/10 level, communication becomes difficult and requires great effort. Assistance to reach the emergency department may be required. Facial flushing and profuse sweating along with potentially dangerous increases in heart rate and blood pressure will be evident. Effect on ADL:   Timing: Intermittent Modifying factors: rest  Onset and Duration: Sudden and Present less than 3 months Cause of pain: Unknown Severity: No change since onset, NAS-11 at its worse: 10/10, NAS-11 at its best: 5/10, NAS-11 now: 8/10 and NAS-11 on the average: 6/10 Timing: Morning, Noon, Afternoon, Evening, Night, Not influenced by the time of the day, After activity or exercise and After a period of immobility Aggravating Factors: Bowel movements, Prolonged standing, Twisting, Walking uphill and Working Alleviating Factors: Lying down, Resting and Sitting Associated Problems: Constipation, Day-time cramps, Night-time cramps, Depression, Fatigue, Numbness, Weakness, Pain that wakes patient up and Pain that does not allow patient to sleep Quality of Pain: Agonizing, Intermittent, Cramping, Disabling, Dreadful, Exhausting, Horrible, Sharp, Shooting, Stabbing and Tiring Previous Examinations or Tests: MRI scan Previous Treatments: Steroid treatments by mouth  The patient comes into the clinics today, referred to [place] for a lumbar epidural steroid injection.  51 year old male who presents with axial low back, buttock and right greater than left leg pain (80% right, 20% left) Started approximately 1 month ago. No inciting event. Patient states that it is very debilitating  for him. Positional change can trigger pain  episodes. Patient states that sitting, resting, laying in his recliner or beneficial.Patient states that when he is walking he will have severe pain causing him to cause and will have leg weakness at that time.  Patient's lumbar MRI results below, significant for synovial cyst off the medial margin of the right L3-L4 facet joint, causing moderate to severe canal stenosis and severe narrowing in the right subarticular recess with impingement on the descending right L4 nerve root.  Plan for right L4-L5 lumbar epidural steroid injection.  Meds   Current Outpatient Prescriptions:  .  Calcipotriene-Betameth Diprop (ENSTILAR EX), Apply topically daily., Disp: , Rfl:  .  levocetirizine (XYZAL) 5 MG tablet, Take 5 mg by mouth daily as needed for allergies., Disp: , Rfl:  .  omeprazole (PRILOSEC) 40 MG capsule, Take 40 mg by mouth daily., Disp: , Rfl:  .  potassium citrate (UROCIT-K) 10 MEQ (1080 MG) SR tablet, Take 10 mEq by mouth 2 (two) times daily., Disp: , Rfl:  .  HYDROcodone-acetaminophen (NORCO/VICODIN) 5-325 MG tablet, Take 1-2 tablets by mouth every 6 (six) hours as needed for severe pain. Post-operatively (Patient not taking: Reported on 12/05/2016), Disp: 20 tablet, Rfl: 0 .  ketorolac (TORADOL) 10 MG tablet, Take 1 tablet (10 mg total) by mouth every 6 (six) hours as needed. For mild pain / stent discomfort. Pt has tollerated prior. (Patient not taking: Reported on 12/05/2016), Disp: 20 tablet, Rfl: 1 .  ondansetron (ZOFRAN) 4 MG tablet, Take 4 mg by mouth every 8 (eight) hours as needed for nausea or vomiting., Disp: , Rfl:  .  senna-docusate (SENOKOT-S) 8.6-50 MG tablet, Take 1 tablet by mouth 2 (two) times daily. While taking strong pain meds to prevent constipation. (Patient not taking: Reported on 12/05/2016), Disp: 20 tablet, Rfl: 0 .  sulfamethoxazole-trimethoprim (BACTRIM DS,SEPTRA DS) 800-160 MG tablet, Take 1 tablet by mouth 2 (two) times daily. X 5 days now. Also take x 3 days  starting day before next Urology appointment. (Patient not taking: Reported on 12/05/2016), Disp: 16 tablet, Rfl: 0  Imaging Review   Results for orders placed during the hospital encounter of 12/11/03  DG Shoulder Left   Narrative Clinical Data: shoulder injury; fell off horse, diffuse pain  LEFT SHOULDER  Three views of the left shoulder reveal no fracture or dislocation. The adjacent ribs are intact. There is no pneumothorax.   There appears to be mild AC joint widening. I would recommend films with and without weights of both AC joints for further evaluation.  IMPRESSION  Suspect AC joint separation.   No fracture or dislocation.  Provider: Durene Fruits, Leta Speller    Lumbosacral Imaging: Lumbar MR wo contrast:  Results for orders placed during the hospital encounter of 11/07/16  MR LUMBAR SPINE WO CONTRAST   Narrative CLINICAL DATA:  Severe low back pain radiating into both legs for 2 weeks. No known injury.  EXAM: MRI LUMBAR SPINE WITHOUT CONTRAST  TECHNIQUE: Multiplanar, multisequence MR imaging of the lumbar spine was performed. No intravenous contrast was administered.  COMPARISON:  MRI lumbar spine 12/26/2011.  FINDINGS: Segmentation:  Standard.  Alignment:  Trace retrolisthesis L4 on L5 is unchanged.  Vertebrae:  No fracture or worrisome lesion.  Conus medullaris: Extends to the  L1 level and appears normal.  Paraspinal and other soft tissues: Negative.  Disc levels:  T12-L1 is imaged in the sagittal plane only and negative.  L1-2: Negative.  L2-3: The appearance of this  level is improved. Shallow disc bulge seen on the prior examination is smaller. There is some facet degenerative disease. Mild central canal narrowing is present. The foramina are open.  L3-4: The appearance of this level has worsened since the prior MRI. The patient has a new synovial cyst off the medial margin of the right facet joint measuring 0.6 cm transverse by 0.5 cm AP by  1 cm craniocaudal. There is a shallow disc bulge and some ligamentum flavum thickening. Moderate to moderately severe central canal stenosis is present. There is severe narrowing in the right subarticular recess due to disc and the synovial cyst. Neural foramina are open.  L4-5: Shallow disc bulge without stenosis, unchanged.  L5-S1: The appearance of this level is markedly improved. Previously seen left lateral recess disc protrusion has resolved. The patient has a shallow disc bulge at this level without stenosis.  IMPRESSION: Worsening spondylosis at L3-4 since the prior examination. New synovial cyst off the medial margin of the right facet joint, shallow disc bulge and ligamentum flavum thickening cause moderate to moderately severe central canal stenosis and severe narrowing in the right subarticular recess with impingement on the descending right L4 root.  Improved appearance of L5-S1 where a left subarticular recess protrusion has resolved since the prior MRI. The central canal and foramina are open.  Improved appearance of L2-3 where a shallow disc bulge is decreased in size. There is mild central canal narrowing at L2-3 without nerve root compression.   Electronically Signed   By: Inge Rise M.D.   On: 11/07/2016 22:51       Complexity Note: Imaging results reviewed. Results shared with Mr. Higashi, using Layman's terms.                         ROS  Cardiovascular History: No reported cardiovascular signs or symptoms such as High blood pressure, coronary artery disease, abnormal heart rate or rhythm, heart attack, blood thinner therapy or heart weakness and/or failure Pulmonary or Respiratory History: No reported pulmonary signs or symptoms such as wheezing and difficulty taking a deep full breath (Asthma), difficulty blowing air out (Emphysema), coughing up mucus (Bronchitis), persistent dry cough, or temporary stoppage of breathing during  sleep Neurological History: No reported neurological signs or symptoms such as seizures, abnormal skin sensations, urinary and/or fecal incontinence, being born with an abnormal open spine and/or a tethered spinal cord Review of Past Neurological Studies: No results found for this or any previous visit. Psychological-Psychiatric History: No reported psychological or psychiatric signs or symptoms such as difficulty sleeping, anxiety, depression, delusions or hallucinations (schizophrenial), mood swings (bipolar disorders) or suicidal ideations or attempts Gastrointestinal History: Reflux or heatburn Genitourinary History: Passing kidney stones Hematological History: No reported hematological signs or symptoms such as prolonged bleeding, low or poor functioning platelets, bruising or bleeding easily, hereditary bleeding problems, low energy levels due to low hemoglobin or being anemic Endocrine History: No reported endocrine signs or symptoms such as high or low blood sugar, rapid heart rate due to high thyroid levels, obesity or weight gain due to slow thyroid or thyroid disease Rheumatologic History: No reported rheumatological signs and symptoms such as fatigue, joint pain, tenderness, swelling, redness, heat, stiffness, decreased range of motion, with or without associated rash Musculoskeletal History: Negative for myasthenia gravis, muscular dystrophy, multiple sclerosis or malignant hyperthermia Work History: Working full time  Allergies  Mr. Staup is allergic to bee venom.  Laboratory Chemistry  Inflammation Markers (CRP: Acute  Phase) (ESR: Chronic Phase) No results found for: CRP, ESRSEDRATE               Renal Function Markers Lab Results  Component Value Date   BUN 22 (H) 07/04/2016   CREATININE 1.85 (H) 07/04/2016   GFRAA 48 (L) 07/04/2016   GFRNONAA 41 (L) 07/04/2016                 Hepatic Function Markers Lab Results  Component Value Date   AST 119 (H) 12/04/2013   ALT  126 (H) 12/04/2013   ALBUMIN 4.3 12/04/2013   ALKPHOS 56 12/04/2013                 Electrolytes Lab Results  Component Value Date   NA 139 07/04/2016   K 3.7 07/04/2016   CL 102 07/04/2016   CALCIUM 8.7 (L) 07/04/2016                 Neuropathy Markers No results found for: [id number]               Bone Pathology Markers Lab Results  Component Value Date   ALKPHOS 56 12/04/2013   CALCIUM 8.7 (L) 07/04/2016                 Coagulation Parameters Lab Results  Component Value Date   PLT 350 07/04/2016                 Cardiovascular Markers Lab Results  Component Value Date   HGB 12.3 (L) 07/04/2016   HCT 36.9 (L) 07/04/2016                 Note: Lab results reviewed.  [place]  Drug: Mr. Haithcock  reports that he does not use drugs. Alcohol:  reports that he does not drink alcohol. Tobacco:  reports that he has never smoked. He has never used smokeless tobacco. Medical:  has a past medical history of History of kidney stones; Pneumothorax on right (11/2013); and PONV (postoperative nausea and vomiting). Family: family history includes Diabetes in his maternal grandmother; Hypertension in his father.  Past Surgical History:  Procedure Laterality Date  . APPENDECTOMY    . CYSTOSCOPY WITH RETROGRADE PYELOGRAM, URETEROSCOPY AND STENT PLACEMENT N/A 07/04/2016   Procedure: CYSTOSCOPY WITH RETROGRADE PYELOGRAM, URETEROSCOPY AND STENT PLACEMENT;  Surgeon: Alexis Frock, MD;  Location: [facility];  Service: Urology;  Laterality: N/A;  . HERNIA REPAIR     left inguinla hernia repair   . HOLMIUM LASER APPLICATION Right 10/28/4460   Procedure: HOLMIUM LASER APPLICATION;  Surgeon: Alexis Frock, MD;  Location: [facility];  Service: Urology;  Laterality: Right;  . left shoulder surgery      Active Ambulatory Problems    Diagnosis Date Noted  . Lumbar radiculopathy 12/05/2016  . Spinal stenosis of lumbar region without neurogenic claudication 12/05/2016  . Lumbar spondylosis 12/05/2016   . 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 No Resolved Ambulatory Problems   Past Medical History:  Diagnosis Date  . History of kidney stones   . Pneumothorax on right 11/2013  . PONV (postoperative nausea and vomiting)    Constitutional Exam  General appearance: Well nourished, well developed, and well hydrated. In no apparent acute distress Vitals:   12/05/16 0946 12/05/16 0948 12/05/16 0953 12/05/16 0957  BP: (!) 161/105 (!) 157/103 (!) 143/106 (!) 160/107  Pulse:      Resp: 10 13  14   Temp:      TempSrc:      SpO2: 98% 98% 100%   Weight:      Height:       BMI Assessment: Estimated body mass index is 25.1 kg/m as calculated from the following:   Height as of this encounter: '5\' 11"'  (1.803 m).   Weight as of this encounter: 180 lb (81.6 kg).  BMI interpretation table: BMI level Category Range association with higher incidence of chronic pain  <18 kg/m2 Underweight   18.5-24.9 kg/m2 Ideal body weight   25-29.9 kg/m2 Overweight Increased incidence by 20%  30-34.9 kg/m2 Obese (Class I) Increased incidence by 68%  35-39.9 kg/m2 Severe obesity (Class II) Increased incidence by 136%  >40 kg/m2 Extreme obesity (Class III) Increased incidence by 254%   BMI Readings from Last 4 Encounters:  12/05/16 25.10 kg/m  07/04/16 24.24 kg/m   Wt Readings from Last 4 Encounters:  12/05/16 180 lb (81.6 kg)  07/04/16 173 lb 12.8 oz (78.8 kg)  Psych/Mental status: Alert, oriented x 3 (person, place, & time)       Eyes: PERLA Respiratory: No evidence of acute respiratory distress  Lumbar Spine Area Exam  Skin & Axial Inspection: No masses, redness, or swelling Alignment: Symmetrical Functional ROM: Unrestricted ROM      Stability: No instability detected Muscle Tone/Strength: Functionally intact. No obvious neuro-muscular anomalies detected. Sensory (Neurological): Unimpaired Palpation: No palpable anomalies        Provocative Tests: Lumbar Hyperextension and rotation test: Positive bilaterally for facet joint pain. Lumbar Lateral bending test: Positive due to pain. Patrick's Maneuver: evaluation deferred today                    Gait & Posture Assessment  Ambulation: Limited Gait: Antalgic Posture: Difficulty standing up straight, due to pain   Lower Extremity Exam    Side: Right lower extremity  Side: Left lower extremity  Skin & Extremity Inspection: Skin color, temperature, and hair growth are WNL. No peripheral edema or cyanosis. No masses, redness, swelling, asymmetry, or associated skin lesions. No contractures.  Skin & Extremity Inspection: Skin color, temperature, and hair growth are WNL. No peripheral edema or cyanosis. No masses, redness, swelling, asymmetry, or associated skin lesions. No contractures.  Functional ROM: Unrestricted ROM          Functional ROM: Unrestricted ROM          Muscle Tone/Strength: Functionally intact. No obvious neuro-muscular anomalies detected.  Muscle Tone/Strength: Functionally intact. No obvious neuro-muscular anomalies detected.  Sensory (Neurological): Unimpaired  Sensory (Neurological): Unimpaired  Palpation: No palpable anomalies  Palpation: No palpable anomalies    Procedure 51 year old male who presents with axial low back, buttock and right greater than left leg pain (80% right, 20% left) Started approximately 1 month ago. No inciting event. Patient states that it is very debilitating for him. Positional change can trigger pain episodes. Patient states that sitting, resting, laying in his recliner or beneficial.Patient states that when he is walking he will have severe pain causing him to cause and will have leg weakness at that time.  Patient's lumbar MRI results below, significant for synovial cyst off the medial margin of the right L3-L4 facet joint, causing moderate to severe canal stenosis and severe narrowing in the right subarticular recess with  impingement on the descending right L4 nerve root.  Plan for right L4-L5 lumbar epidural steroid injection.

## 2016-12-05 NOTE — Progress Notes (Signed)
Safety precautions to be maintained throughout the outpatient stay will include: orient to surroundings, keep bed in low position, maintain call bell within reach at all times, provide assistance with transfer out of bed and ambulation.  

## 2016-12-05 NOTE — Patient Instructions (Signed)

## 2016-12-05 NOTE — Progress Notes (Signed)
Patient's Name: Bryce Medina  MRN: [id number]  Referring Provider: Meade Maw, MD  DOB: 05-05-1965  PCP: Derinda Late, MD  DOS: 12/05/2016  Note by: Gillis Santa, MD  Service setting: Ambulatory outpatient  Specialty: Interventional Pain Management  Patient type: Established  Location: [facility] (AMB) Pain Management Facility  Visit type: Interventional Procedure   Primary Reason for Visit: Interventional Pain Management Treatment. CC: Back Pain (low and to bilateral buttocks) and Leg Pain (bilateral)  Procedure:  Anesthesia, Analgesia, Anxiolysis:  Type: Therapeutic Inter-Laminar Epidural Steroid Injection Region: Lumbar Level: L4-5 Level. Laterality: Right-Sided         Type: Local Anesthesia Local Anesthetic: Lidocaine 1% Route: Infiltration ([place]/IM) IV Access: Secured Sedation: Meaningful verbal contact was maintained at all times during the procedure  Indication(s): Analgesia and Anxiety   Indications: 1. Lumbar radiculopathy   2. Spinal stenosis of lumbar region without neurogenic claudication   3. Synovial cyst of lumbar spine   4. Lumbar spondylosis    Pain Score: Pre-procedure: 8 /10  Post-procedure: 0-No pain/10  Pre-op Assessment:  Bryce Medina is a 51 y.o. (year old), male patient, seen today for interventional treatment. He  has a past surgical history that includes Hernia repair; left shoulder surgery ; Appendectomy; Cystoscopy with retrograde pyelogram, ureteroscopy and stent placement (N/A, 07/04/2016); and Holmium laser application (Right, 08/26/6965). Bryce Medina has a current medication list which includes the following prescription(s): calcipotriene-betameth diprop, levocetirizine, omeprazole, potassium citrate, hydrocodone-acetaminophen, ketorolac, ondansetron, senna-docusate, and sulfamethoxazole-trimethoprim. His primarily concern today is the Back Pain (low and to bilateral buttocks) and Leg Pain (bilateral)  Initial Vital Signs: There were no vitals  taken for this visit. BMI: Estimated body mass index is 25.1 kg/m as calculated from the following:   Height as of this encounter: 5\' 11"  (1.803 m).   Weight as of this encounter: 180 lb (81.6 kg).  Risk Assessment: Allergies: Reviewed. He is allergic to bee venom.  Allergy Precautions: None required Coagulopathies: Reviewed. None identified.  Blood-thinner therapy: None at this time Active Infection(s): Reviewed. None identified. Bryce Medina is afebrile  Site Confirmation: Bryce Medina was asked to confirm the procedure and laterality before marking the site Procedure checklist: Completed Consent: Before the procedure and under the influence of no sedative(s), amnesic(s), or anxiolytics, the patient was informed of the treatment options, risks and possible complications. To fulfill our ethical and legal obligations, as recommended by the American Medical Association's Code of Ethics, I have informed the patient of my clinical impression; the nature and purpose of the treatment or procedure; the risks, benefits, and possible complications of the intervention; the alternatives, including doing nothing; the risk(s) and benefit(s) of the alternative treatment(s) or procedure(s); and the risk(s) and benefit(s) of doing nothing. The patient was provided information about the general risks and possible complications associated with the procedure. These may include, but are not limited to: failure to achieve desired goals, infection, bleeding, organ or nerve damage, allergic reactions, paralysis, and death. In addition, the patient was informed of those risks and complications associated to Spine-related procedures, such as failure to decrease pain; infection (i.e.: Meningitis, epidural or intraspinal abscess); bleeding (i.e.: epidural hematoma, subarachnoid hemorrhage, or any other type of intraspinal or peri-dural bleeding); organ or nerve damage (i.e.: Any type of peripheral nerve, nerve root, or spinal  cord injury) with subsequent damage to sensory, motor, and/or autonomic systems, resulting in permanent pain, numbness, and/or weakness of one or several areas of the body; allergic reactions; (i.e.: anaphylactic reaction); and/or death. Furthermore,  the patient was informed of those risks and complications associated with the medications. These include, but are not limited to: allergic reactions (i.e.: anaphylactic or anaphylactoid reaction(s)); adrenal axis suppression; blood sugar elevation that in diabetics may result in ketoacidosis or comma; water retention that in patients with history of congestive heart failure may result in shortness of breath, pulmonary edema, and decompensation with resultant heart failure; weight gain; swelling or edema; medication-induced neural toxicity; particulate matter embolism and blood vessel occlusion with resultant organ, and/or nervous system infarction; and/or aseptic necrosis of one or more joints. Finally, the patient was informed that Medicine is not an exact science; therefore, there is also the possibility of unforeseen or unpredictable risks and/or possible complications that may result in a catastrophic outcome. The patient indicated having understood very clearly. We have given the patient no guarantees and we have made no promises. Enough time was given to the patient to ask questions, all of which were answered to the patient's satisfaction. Bryce Medina has indicated that he wanted to continue with the procedure. Attestation: I, the ordering provider, attest that I have discussed with the patient the benefits, risks, side-effects, alternatives, likelihood of achieving goals, and potential problems during recovery for the procedure that I have provided informed consent. Date: 12/05/2016; Time: 8:59 AM  Pre-Procedure Preparation:  Monitoring: As per clinic protocol. Respiration, ETCO2, SpO2, BP, heart rate and rhythm monitor placed and checked for adequate  function Safety Precautions: Patient was assessed for positional comfort and pressure points before starting the procedure. Time-out: I initiated and conducted the "Time-out" before starting the procedure, as per protocol. The patient was asked to participate by confirming the accuracy of the "Time Out" information. Verification of the correct person, site, and procedure were performed and confirmed by me, the nursing staff, and the patient. "Time-out" conducted as per Joint Commission's Universal Protocol (UP.01.01.01). "Time-out" Date & Time: 12/05/2016; 0941 hrs.  Description of Procedure Process:   Position: Prone with head of the table was raised to facilitate breathing. Target Area: The interlaminar space, initially targeting the lower laminar border of the superior vertebral body. Approach: Paramedial approach. Area Prepped: Entire Posterior Lumbar Region Prepping solution: ChloraPrep (2% chlorhexidine gluconate and 70% isopropyl alcohol) Safety Precautions: Aspiration looking for blood return was conducted prior to all injections. At no point did we inject any substances, as a needle was being advanced. No attempts were made at seeking any paresthesias. Safe injection practices and needle disposal techniques used. Medications properly checked for expiration dates. SDV (single dose vial) medications used. Description of the Procedure: Protocol guidelines were followed. The procedure needle was introduced through the skin, ipsilateral to the reported pain, and advanced to the target area. Bone was contacted and the needle walked caudad, until the lamina was cleared. The epidural space was identified using "loss-of-resistance technique" with 2-3 ml of PF-NaCl (0.9% NSS), in a 5cc LOR glass syringe. Vitals:   12/05/16 0946 12/05/16 0948 12/05/16 0953 12/05/16 0957  BP: (!) 161/105 (!) 157/103 (!) 143/106 (!) 160/107  Pulse:      Resp: 10 13 14    Temp:      TempSrc:      SpO2: 98% 98% 100%    Weight:      Height:        Start Time: 0942 hrs. End Time: 0952 hrs. Materials:  Needle(s) Type: Epidural needle Gauge: 17G Length: 3.5-in Medication(s): We administered iopamidol, ropivacaine (PF) 2 mg/mL (0.2%), sodium chloride flush, lidocaine (PF), and dexamethasone. Please see  chart orders for dosing details. 5 cc of normal saline preservative-free, 2 cc of 0.2% ropivacaine, 1 cc of Decadron 10 mg/cc equals 8 cc total. Imaging Guidance (Spinal):  Type of Imaging Technique: Fluoroscopy Guidance (Spinal) Indication(s): Assistance in needle guidance and placement for procedures requiring needle placement in or near specific anatomical locations not easily accessible without such assistance. Exposure Time: Please see nurses notes. Contrast: Before injecting any contrast, we confirmed that the patient did not have an allergy to iodine, shellfish, or radiological contrast. Once satisfactory needle placement was completed at the desired level, radiological contrast was injected. Contrast injected under live fluoroscopy. No contrast complications. See chart for type and volume of contrast used. Fluoroscopic Guidance: I was personally present during the use of fluoroscopy. "Tunnel Vision Technique" used to obtain the best possible view of the target area. Parallax error corrected before commencing the procedure. "Direction-depth-direction" technique used to introduce the needle under continuous pulsed fluoroscopy. Once target was reached, antero-posterior, oblique, and lateral fluoroscopic projection used confirm needle placement in all planes. Images permanently stored in EMR. Interpretation: I personally interpreted the imaging intraoperatively. Adequate needle placement confirmed in multiple planes. Appropriate spread of contrast into desired area was observed. No evidence of afferent or efferent intravascular uptake. No intrathecal or subarachnoid spread observed. Permanent images saved into the  patient's record.  Antibiotic Prophylaxis:  Indication(s): None identified Antibiotic given: None  Post-operative Assessment:  EBL: None Complications: No immediate post-treatment complications observed by team, or reported by patient. Note: The patient tolerated the entire procedure well. A repeat set of vitals were taken after the procedure and the patient was kept under observation following institutional policy, for this type of procedure. Post-procedural neurological assessment was performed, showing return to baseline, prior to discharge. The patient was provided with post-procedure discharge instructions, including a section on how to identify potential problems. Should any problems arise concerning this procedure, the patient was given instructions to immediately contact us, at any time, without hesitation. In any case, we plan to contact the patient by telephone for a follow-up status report regarding this interventional procedure. Comments:  No additional relevant information.  Plan of Care  5 out of 5 strength bilateral lower extremity: Plantar flexion, dorsiflexion, knee flexion, knee extension.  Imaging Orders     DG C-Arm 1-60 Min-No Report Procedure Orders    No procedure(s) ordered today    Medications ordered for procedure: Meds ordered this encounter  Medications  . iopamidol (ISOVUE-M) 41 % intrathecal injection 10 mL  . ropivacaine (PF) 2 mg/mL (0.2%) (NAROPIN) injection 2 mL  . sodium chloride flush (NS) 0.9 % injection 2 mL  . lidocaine (PF) (XYLOCAINE) 1 % injection 10 mL  . dexamethasone (DECADRON) injection 10 mg   Medications administered: We administered iopamidol, ropivacaine (PF) 2 mg/mL (0.2%), sodium chloride flush, lidocaine (PF), and dexamethasone.  See the medical record for exact dosing, route, and time of administration.  New Prescriptions   No medications on file   Disposition: Discharge home  Discharge Date & Time: 12/05/2016; 1000 hrs.    Physician-requested Follow-up: Return in about 2 weeks (around 12/19/2016) for Post Procedure Evaluation. Future Appointments Date Time Provider [place]  12/19/2016 8:45 AM Gillis Santa, MD [facility] None  11/26/2017 7:45 AM Hayden Pedro, MD [facility] None   Primary Care Physician: Derinda Late, MD Location: [facility] Note by: Gillis Santa, MD Date: 12/05/2016; Time: 11:56 AM  Disclaimer:  Medicine is not an exact science. The only  guarantee in medicine is that nothing is guaranteed. It is important to note that the decision to proceed with this intervention was based on the information collected from the patient. The Data and conclusions were drawn from the patient's questionnaire, the interview, and the physical examination. Because the information was provided in large part by the patient, it cannot be guaranteed that it has not been purposely or unconsciously manipulated. Every effort has been made to obtain as much relevant data as possible for this evaluation. It is important to note that the conclusions that lead to this procedure are derived in large part from the available data. Always take into account that the treatment will also be dependent on availability of resources and existing treatment guidelines, considered by other Pain Management Practitioners as being common knowledge and practice, at the time of the intervention. For Medico-Legal purposes, it is also important to point out that variation in procedural techniques and pharmacological choices are the acceptable norm. The indications, contraindications, technique, and results of the above procedure should only be interpreted and judged by a Board-Certified Interventional Pain Specialist with extensive familiarity and expertise in the same exact procedure and technique.

## 2016-12-06 ENCOUNTER — Telehealth: Payer: Self-pay | Admitting: *Deleted

## 2016-12-06 NOTE — Telephone Encounter (Signed)
Left voicemail for patient to return call for any post procedure issues. 

## 2016-12-14 ENCOUNTER — Encounter
Admission: RE | Admit: 2016-12-14 | Discharge: 2016-12-14 | Disposition: A | Discharge status: Home / Self Care | Payer: BLUE CROSS/BLUE SHIELD | Source: Ambulatory Visit | Attending: Neurosurgery | Admitting: Neurosurgery

## 2016-12-14 DIAGNOSIS — Z01812 Encounter for preprocedural laboratory examination: Secondary | ICD-10-CM | POA: Diagnosis present

## 2016-12-14 DIAGNOSIS — Z01810 Encounter for preprocedural cardiovascular examination: Secondary | ICD-10-CM | POA: Diagnosis present

## 2016-12-14 DIAGNOSIS — M5416 Radiculopathy, lumbar region: Secondary | ICD-10-CM | POA: Diagnosis not present

## 2016-12-14 HISTORY — DX: Gastro-esophageal reflux disease without esophagitis: K21.9

## 2016-12-14 HISTORY — DX: Multiple fractures of ribs, unspecified side, initial encounter for closed fracture: S22.49XA

## 2016-12-14 HISTORY — DX: Fracture of one rib, unspecified side, initial encounter for closed fracture: S22.39XA

## 2016-12-14 LAB — CBC
HEMATOCRIT: 43.4 % (ref 40.0–52.0)
HEMOGLOBIN: 14.9 g/dL (ref 13.0–18.0)
MCH: 29.8 pg (ref 26.0–34.0)
MCHC: 34.3 g/dL (ref 32.0–36.0)
MCV: 87 fL (ref 80.0–100.0)
Platelets: 288 10*3/uL (ref 150–440)
RBC: 4.99 MIL/uL (ref 4.40–5.90)
RDW: 13.6 % (ref 11.5–14.5)
WBC: 7.1 10*3/uL (ref 3.8–10.6)

## 2016-12-14 LAB — BASIC METABOLIC PANEL
Anion gap: 8 (ref 5–15)
BUN: 16 mg/dL (ref 6–20)
CHLORIDE: 103 mmol/L (ref 101–111)
CO2: 30 mmol/L (ref 22–32)
CREATININE: 1.12 mg/dL (ref 0.61–1.24)
Calcium: 9.3 mg/dL (ref 8.9–10.3)
GFR calc Af Amer: >60 mL/min (ref >60)
GFR calc non Af Amer: >60 mL/min (ref >60)
Glucose, Bld: 93 mg/dL (ref 65–99)
Potassium: 3.7 mmol/L (ref 3.5–5.1)
SODIUM: 141 mmol/L (ref 135–145)

## 2016-12-14 LAB — URINALYSIS, ROUTINE W REFLEX MICROSCOPIC
BACTERIA UA: NONE SEEN
Bilirubin Urine: NEGATIVE
Glucose, UA: NEGATIVE mg/dL
Ketones, ur: NEGATIVE mg/dL
LEUKOCYTES UA: NEGATIVE
NITRITE: NEGATIVE
Protein, ur: NEGATIVE mg/dL
SPECIFIC GRAVITY, URINE: 1.004 — AB (ref 1.005–1.030)
pH: 6 (ref 5.0–8.0)

## 2016-12-14 LAB — APTT: aPTT: 30 seconds (ref 24–36)

## 2016-12-14 LAB — PROTIME-INR
INR: 0.93
Prothrombin Time: 12.4 seconds (ref 11.4–15.2)

## 2016-12-14 LAB — TYPE AND SCREEN
ABO/RH(D): A NEG
ANTIBODY SCREEN: NEGATIVE

## 2016-12-14 LAB — SURGICAL PCR SCREEN
MRSA, PCR: NEGATIVE
STAPHYLOCOCCUS AUREUS: POSITIVE — AB

## 2016-12-14 NOTE — Patient Instructions (Signed)
Your procedure is scheduled on: Wednesday, December 19, 2016 Report to Same Day Surgery on the 2nd floor in the [facility]. To find out your arrival time, please call [phone] between 1PM - 3PM on: Tuesday, December 18, 2016  REMEMBER: Instructions that are not followed completely may result in serious medical risk up to and including death; or upon the discretion of your surgeon and anesthesiologist your surgery may need to be rescheduled.  Do not eat food or drink liquids after midnight. No gum chewing or hard candies.  You may however, drink CLEAR liquids up to 2 hours before you are scheduled to arrive at the hospital for your procedure.  Do not drink clear liquids within 2 hours of your scheduled arrival to the hospital as this may lead to your procedure being delayed or rescheduled.  Clear liquids include: - water  - apple juice without pulp - clear gatorade - black coffee or tea (NO milk, creamers, sugars) DO NOT drink anything not on this list.  No Alcohol for 24 hours before or after surgery.  No Smoking for 24 hours prior to surgery.  Notify your doctor if there is any change in your medical condition (cold, fever, infection).  Do not wear jewelry, make-up, hairpins, clips or nail polish.  Do not wear lotions, powders, or perfumes.   Do not shave 48 hours prior to surgery. Men may shave face and neck.  Contacts and dentures may not be worn into surgery.  Do not bring valuables to the hospital. [facility] is not responsible for any belongings or valuables.   TAKE THESE MEDICATIONS THE MORNING OF SURGERY WITH A SIP OF WATER:  1.  PRILOSEC (take one capsule the night before surgery and one capsule the morning of surgery - helps to prevent nausea after surgery)  Use CHG Soap as directed on instruction sheet.  Stop Anti-inflammatories such as Advil, Aleve, Ibuprofen, Motrin, Naproxen, Naprosyn, Goodie powder, or aspirin products. (May take Tylenol or Acetaminophen  if needed.)  Stop supplements until after surgery.   If you are being admitted to the hospital overnight, leave your suitcase in the car. After surgery it may be brought to your room.  If you are being discharged the day of surgery, you will not be allowed to drive home. You will need someone to drive you home and stay with you that night.   If you are taking public transportation, you will need to have a responsible adult to with you.  Please call the number above if you have any questions about these instructions.

## 2016-12-18 NOTE — Pre-Procedure Instructions (Signed)
Positive staph aureus results faxed to Dr. Izora Ribas, asked if wanted any treatment and/ or change in antibiotic?

## 2016-12-19 ENCOUNTER — Ambulatory Visit: Payer: BLUE CROSS/BLUE SHIELD

## 2016-12-19 ENCOUNTER — Encounter: Payer: Self-pay | Admitting: *Deleted

## 2016-12-19 ENCOUNTER — Ambulatory Visit: Payer: BLUE CROSS/BLUE SHIELD | Admitting: Anesthesiology

## 2016-12-19 ENCOUNTER — Encounter
Admission: RE | Disposition: A | Discharge status: Home / Self Care | Payer: Self-pay | Source: Ambulatory Visit | Attending: Neurosurgery

## 2016-12-19 ENCOUNTER — Ambulatory Visit
Admission: RE | Admit: 2016-12-19 | Discharge: 2016-12-19 | Disposition: A | Discharge status: Home / Self Care | Payer: BLUE CROSS/BLUE SHIELD | Source: Ambulatory Visit | Attending: Neurosurgery | Admitting: Neurosurgery

## 2016-12-19 ENCOUNTER — Ambulatory Visit: Payer: BLUE CROSS/BLUE SHIELD | Admitting: Student in an Organized Health Care Education/Training Program

## 2016-12-19 DIAGNOSIS — L409 Psoriasis, unspecified: Secondary | ICD-10-CM | POA: Diagnosis not present

## 2016-12-19 DIAGNOSIS — M5416 Radiculopathy, lumbar region: Secondary | ICD-10-CM | POA: Insufficient documentation

## 2016-12-19 DIAGNOSIS — M109 Gout, unspecified: Secondary | ICD-10-CM | POA: Diagnosis not present

## 2016-12-19 DIAGNOSIS — Z79899 Other long term (current) drug therapy: Secondary | ICD-10-CM | POA: Diagnosis not present

## 2016-12-19 DIAGNOSIS — Z87442 Personal history of urinary calculi: Secondary | ICD-10-CM | POA: Diagnosis not present

## 2016-12-19 DIAGNOSIS — M7138 Other bursal cyst, other site: Secondary | ICD-10-CM | POA: Diagnosis not present

## 2016-12-19 DIAGNOSIS — F329 Major depressive disorder, single episode, unspecified: Secondary | ICD-10-CM | POA: Insufficient documentation

## 2016-12-19 DIAGNOSIS — Z419 Encounter for procedure for purposes other than remedying health state, unspecified: Secondary | ICD-10-CM

## 2016-12-19 HISTORY — PX: LUMBAR LAMINECTOMY/DECOMPRESSION MICRODISCECTOMY: SHX5026

## 2016-12-19 LAB — ABO/RH: ABO/RH(D): A NEG

## 2016-12-19 SURGERY — LUMBAR LAMINECTOMY/DECOMPRESSION MICRODISCECTOMY 1 LEVEL
Anesthesia: General | Site: Spine Lumbar | Wound class: Clean

## 2016-12-19 MED ORDER — SUCCINYLCHOLINE CHLORIDE 20 MG/ML IJ SOLN
INTRAMUSCULAR | Status: AC
  Filled 2016-12-19: qty 1

## 2016-12-19 MED ORDER — BUPIVACAINE HCL 0.5 % IJ SOLN
INTRAMUSCULAR | Status: DC | PRN
  Administered 2016-12-19: 20 mL

## 2016-12-19 MED ORDER — SODIUM CHLORIDE 0.9 % IJ SOLN
INTRAMUSCULAR | Status: DC | PRN
  Administered 2016-12-19: 20 mL

## 2016-12-19 MED ORDER — DEXAMETHASONE SODIUM PHOSPHATE 10 MG/ML IJ SOLN
INTRAMUSCULAR | Status: DC | PRN
  Administered 2016-12-19: 10 mg via INTRAVENOUS

## 2016-12-19 MED ORDER — CEFAZOLIN SODIUM-DEXTROSE 2-4 GM/100ML-% IV SOLN
INTRAVENOUS | Status: AC
  Filled 2016-12-19: qty 100

## 2016-12-19 MED ORDER — ONDANSETRON HCL 4 MG/2ML IJ SOLN
INTRAMUSCULAR | Status: AC
  Filled 2016-12-19: qty 2

## 2016-12-19 MED ORDER — THROMBIN (RECOMBINANT) 5000 UNITS EX SOLR
CUTANEOUS | Status: AC
  Filled 2016-12-19: qty 5000

## 2016-12-19 MED ORDER — METHYLPREDNISOLONE ACETATE 40 MG/ML IJ SUSP: INTRAMUSCULAR | Status: DC | PRN

## 2016-12-19 MED ORDER — SCOPOLAMINE 1 MG/3DAYS TD PT72
MEDICATED_PATCH | TRANSDERMAL | Status: AC
  Administered 2016-12-19: 1.5 mg via TRANSDERMAL
  Filled 2016-12-19: qty 1

## 2016-12-19 MED ORDER — OXYCODONE HCL 5 MG PO TABS: 5.0000 mg | ORAL_TABLET | Freq: Once | ORAL | Status: DC | PRN

## 2016-12-19 MED ORDER — MIDAZOLAM HCL 2 MG/2ML IJ SOLN
INTRAMUSCULAR | Status: AC
  Filled 2016-12-19: qty 2

## 2016-12-19 MED ORDER — LACTATED RINGERS IV SOLN
INTRAVENOUS | Status: DC
  Administered 2016-12-19 (×2): via INTRAVENOUS

## 2016-12-19 MED ORDER — SUCCINYLCHOLINE CHLORIDE 20 MG/ML IJ SOLN
INTRAMUSCULAR | Status: DC | PRN
  Administered 2016-12-19: 100 mg via INTRAVENOUS

## 2016-12-19 MED ORDER — MEPERIDINE HCL 50 MG/ML IJ SOLN
6.2500 mg | INTRAMUSCULAR | Status: DC | PRN
Start: 2016-12-19 — End: 2016-12-19
  Administered 2016-12-19: 12.5 mg via INTRAVENOUS

## 2016-12-19 MED ORDER — ONDANSETRON HCL 4 MG/2ML IJ SOLN
INTRAMUSCULAR | Status: DC | PRN
  Administered 2016-12-19 (×2): 4 mg via INTRAVENOUS

## 2016-12-19 MED ORDER — SODIUM CHLORIDE FLUSH 0.9 % IV SOLN
INTRAVENOUS | Status: AC
  Filled 2016-12-19: qty 10

## 2016-12-19 MED ORDER — LIDOCAINE HCL (CARDIAC) 20 MG/ML IV SOLN
INTRAVENOUS | Status: DC | PRN
  Administered 2016-12-19: 80 mg via INTRAVENOUS

## 2016-12-19 MED ORDER — ACETAMINOPHEN 10 MG/ML IV SOLN
INTRAVENOUS | Status: DC | PRN
  Administered 2016-12-19: 1000 mg via INTRAVENOUS

## 2016-12-19 MED ORDER — FAMOTIDINE 20 MG PO TABS
ORAL_TABLET | ORAL | Status: AC
  Administered 2016-12-19: 20 mg
  Filled 2016-12-19: qty 1

## 2016-12-19 MED ORDER — BUPIVACAINE LIPOSOME 1.3 % IJ SUSP
INTRAMUSCULAR | Status: DC | PRN
Start: 2016-12-19 — End: 2016-12-19
  Administered 2016-12-19: 20 mL

## 2016-12-19 MED ORDER — OXYCODONE HCL 5 MG/5ML PO SOLN: 5.0000 mg | Freq: Once | ORAL | Status: DC | PRN

## 2016-12-19 MED ORDER — PROPOFOL 10 MG/ML IV BOLUS
INTRAVENOUS | Status: AC
  Filled 2016-12-19: qty 20

## 2016-12-19 MED ORDER — PROPOFOL 10 MG/ML IV BOLUS
INTRAVENOUS | Status: DC | PRN
  Administered 2016-12-19: 160 mg via INTRAVENOUS

## 2016-12-19 MED ORDER — CEFAZOLIN SODIUM-DEXTROSE 2-4 GM/100ML-% IV SOLN
2.0000 g | Freq: Once | INTRAVENOUS | Status: AC
  Administered 2016-12-19: 2 g via INTRAVENOUS

## 2016-12-19 MED ORDER — ROCURONIUM BROMIDE 100 MG/10ML IV SOLN
INTRAVENOUS | Status: DC | PRN
  Administered 2016-12-19: 10 mg via INTRAVENOUS

## 2016-12-19 MED ORDER — REMIFENTANIL HCL 1 MG IV SOLR
INTRAVENOUS | Status: DC | PRN
  Administered 2016-12-19: .15 ug/kg/min via INTRAVENOUS

## 2016-12-19 MED ORDER — FENTANYL CITRATE (PF) 100 MCG/2ML IJ SOLN
INTRAMUSCULAR | Status: AC
  Filled 2016-12-19: qty 2

## 2016-12-19 MED ORDER — PROPOFOL 10 MG/ML IV BOLUS
INTRAVENOUS | Status: AC
Start: 2016-12-19 — End: 2016-12-19
  Filled 2016-12-19: qty 20

## 2016-12-19 MED ORDER — BUPIVACAINE-EPINEPHRINE (PF) 0.5% -1:200000 IJ SOLN
INTRAMUSCULAR | Status: DC | PRN
  Administered 2016-12-19: 10 mL

## 2016-12-19 MED ORDER — METHYLPREDNISOLONE ACETATE 40 MG/ML IJ SUSP
INTRAMUSCULAR | Status: AC
  Filled 2016-12-19: qty 1

## 2016-12-19 MED ORDER — MIDAZOLAM HCL 2 MG/2ML IJ SOLN
INTRAMUSCULAR | Status: DC | PRN
  Administered 2016-12-19: 2 mg via INTRAVENOUS

## 2016-12-19 MED ORDER — SODIUM CHLORIDE 0.9 % IJ SOLN
INTRAMUSCULAR | Status: AC
  Filled 2016-12-19: qty 20

## 2016-12-19 MED ORDER — CYCLOBENZAPRINE HCL 5 MG PO TABS: 5.0000 mg | ORAL_TABLET | Freq: Three times a day (TID) | ORAL | 0 refills | Status: DC | PRN

## 2016-12-19 MED ORDER — LIDOCAINE HCL (PF) 2 % IJ SOLN
INTRAMUSCULAR | Status: AC
  Filled 2016-12-19: qty 10

## 2016-12-19 MED ORDER — GELATIN ABSORBABLE 12-7 MM EX MISC
CUTANEOUS | Status: AC
  Filled 2016-12-19: qty 1

## 2016-12-19 MED ORDER — MEPERIDINE HCL 50 MG/ML IJ SOLN
INTRAMUSCULAR | Status: AC
  Filled 2016-12-19: qty 1

## 2016-12-19 MED ORDER — GELATIN ABSORBABLE 12-7 MM EX MISC
CUTANEOUS | Status: DC | PRN
  Administered 2016-12-19: 1

## 2016-12-19 MED ORDER — REMIFENTANIL HCL 1 MG IV SOLR
INTRAVENOUS | Status: AC
  Filled 2016-12-19: qty 1000

## 2016-12-19 MED ORDER — BUPIVACAINE LIPOSOME 1.3 % IJ SUSP
INTRAMUSCULAR | Status: AC
  Filled 2016-12-19: qty 20

## 2016-12-19 MED ORDER — FENTANYL CITRATE (PF) 100 MCG/2ML IJ SOLN
INTRAMUSCULAR | Status: DC | PRN
  Administered 2016-12-19: 50 ug via INTRAVENOUS
  Administered 2016-12-19: 100 ug via INTRAVENOUS

## 2016-12-19 MED ORDER — FENTANYL CITRATE (PF) 100 MCG/2ML IJ SOLN: 25.0000 ug | INTRAMUSCULAR | Status: DC | PRN

## 2016-12-19 MED ORDER — PROMETHAZINE HCL 25 MG/ML IJ SOLN: 6.2500 mg | INTRAMUSCULAR | Status: DC | PRN

## 2016-12-19 MED ORDER — BACITRACIN 50000 UNITS IM SOLR
INTRAMUSCULAR | Status: DC | PRN
  Administered 2016-12-19: 50000 [IU]

## 2016-12-19 MED ORDER — THROMBIN (RECOMBINANT) 5000 UNITS EX SOLR
CUTANEOUS | Status: DC | PRN
  Administered 2016-12-19: 5000 [IU] via TOPICAL

## 2016-12-19 MED ORDER — ROCURONIUM BROMIDE 50 MG/5ML IV SOLN
INTRAVENOUS | Status: AC
  Filled 2016-12-19: qty 1

## 2016-12-19 MED ORDER — BUPIVACAINE-EPINEPHRINE (PF) 0.5% -1:200000 IJ SOLN
INTRAMUSCULAR | Status: AC
  Filled 2016-12-19: qty 30

## 2016-12-19 MED ORDER — SCOPOLAMINE 1 MG/3DAYS TD PT72
1.0000 | MEDICATED_PATCH | TRANSDERMAL | Status: DC
  Administered 2016-12-19: 1.5 mg via TRANSDERMAL

## 2016-12-19 MED ORDER — ACETAMINOPHEN 10 MG/ML IV SOLN
INTRAVENOUS | Status: AC
  Filled 2016-12-19: qty 100

## 2016-12-19 MED ORDER — BUPIVACAINE HCL (PF) 0.5 % IJ SOLN
INTRAMUSCULAR | Status: AC
  Filled 2016-12-19: qty 30

## 2016-12-19 MED ORDER — OXYCODONE HCL 5 MG PO TABS: 5.0000 mg | ORAL_TABLET | Freq: Once | ORAL | 0 refills | Status: DC | PRN

## 2016-12-19 MED ORDER — DEXAMETHASONE SODIUM PHOSPHATE 10 MG/ML IJ SOLN
INTRAMUSCULAR | Status: AC
  Filled 2016-12-19: qty 1

## 2016-12-19 MED ORDER — BACITRACIN 50000 UNITS IM SOLR
INTRAMUSCULAR | Status: AC
  Filled 2016-12-19: qty 1

## 2016-12-19 SURGICAL SUPPLY — 65 items
BLADE BOVIE TIP EXT 4 (BLADE) ×3 IMPLANT
BUR NEURO DRILL SOFT 3.0X3.8M (BURR) ×3 IMPLANT
CANISTER SUCT 1200ML W/VALVE (MISCELLANEOUS) ×6 IMPLANT
CHLORAPREP W/TINT 26ML (MISCELLANEOUS) ×6 IMPLANT
CNTNR SPEC 2.5X3XGRAD LEK (MISCELLANEOUS) ×1
CONT SPEC 4OZ STER OR WHT (MISCELLANEOUS) ×2
CONTAINER SPEC 2.5X3XGRAD LEK (MISCELLANEOUS) ×1 IMPLANT
COUNTER NEEDLE 20/40 LG (NEEDLE) ×3 IMPLANT
COVER LIGHT HANDLE STERIS (MISCELLANEOUS) ×6 IMPLANT
CUP MEDICINE 2OZ PLAST GRAD ST (MISCELLANEOUS) ×6 IMPLANT
DERMABOND ADVANCED (GAUZE/BANDAGES/DRESSINGS) ×2
DERMABOND ADVANCED .7 DNX12 (GAUZE/BANDAGES/DRESSINGS) ×1 IMPLANT
DRAPE C-ARM 42X72 X-RAY (DRAPES) ×6 IMPLANT
DRAPE LAPAROTOMY 100X77 ABD (DRAPES) ×3 IMPLANT
DRAPE MICROSCOPE SPINE 48X150 (DRAPES) ×3 IMPLANT
DRAPE POUCH INSTRU U-SHP 10X18 (DRAPES) ×3 IMPLANT
DRAPE SURG 17X11 SM STRL (DRAPES) ×12 IMPLANT
DRSG TEGADERM 4X4.75 (GAUZE/BANDAGES/DRESSINGS) IMPLANT
DRSG TELFA 4X3 1S NADH ST (GAUZE/BANDAGES/DRESSINGS) IMPLANT
DURASEAL APPLICATOR TIP (TIP) IMPLANT
DURASEAL SPINE SEALANT 3ML (MISCELLANEOUS) IMPLANT
ELECT CAUTERY BLADE TIP 2.5 (TIP) ×3
ELECT EZSTD 165MM 6.5IN (MISCELLANEOUS)
ELECT REM PT RETURN 9FT ADLT (ELECTROSURGICAL) ×3
ELECTRODE CAUTERY BLDE TIP 2.5 (TIP) ×1 IMPLANT
ELECTRODE EZSTD 165MM 6.5IN (MISCELLANEOUS) IMPLANT
ELECTRODE REM PT RTRN 9FT ADLT (ELECTROSURGICAL) ×1 IMPLANT
FRAME EYE SHIELD (PROTECTIVE WEAR) ×3 IMPLANT
GLOVE BIO SURGEON STRL SZ 6.5 (GLOVE) ×4 IMPLANT
GLOVE BIO SURGEONS STRL SZ 6.5 (GLOVE) ×2
GLOVE BIOGEL PI IND STRL 7.0 (GLOVE) ×2 IMPLANT
GLOVE BIOGEL PI INDICATOR 7.0 (GLOVE) ×4
GLOVE SURG SYN 8.5  E (GLOVE) ×6
GLOVE SURG SYN 8.5 E (GLOVE) ×3 IMPLANT
GOWN SRG XL LVL 3 NONREINFORCE (GOWNS) ×1 IMPLANT
GOWN STRL NON-REIN TWL XL LVL3 (GOWNS) ×2
GOWN STRL REUS W/ TWL LRG LVL3 (GOWN DISPOSABLE) ×1 IMPLANT
GOWN STRL REUS W/TWL LRG LVL3 (GOWN DISPOSABLE) ×2
GRADUATE 1200CC STRL 31836 (MISCELLANEOUS) ×3 IMPLANT
KIT SPINAL PRONEVIEW (KITS) ×3 IMPLANT
KNIFE BAYONET SHORT DISCETOMY (MISCELLANEOUS) IMPLANT
MARKER SKIN DUAL TIP RULER LAB (MISCELLANEOUS) ×9 IMPLANT
NDL SAFETY ECLIPSE 18X1.5 (NEEDLE) ×1 IMPLANT
NEEDLE HYPO 18GX1.5 SHARP (NEEDLE) ×2
NEEDLE HYPO 22GX1.5 SAFETY (NEEDLE) ×3 IMPLANT
NS IRRIG 1000ML POUR BTL (IV SOLUTION) ×3 IMPLANT
PACK LAMINECTOMY NEURO (CUSTOM PROCEDURE TRAY) ×3 IMPLANT
PAD ARMBOARD 7.5X6 YLW CONV (MISCELLANEOUS) ×3 IMPLANT
PATTIES SURGICAL .5X1.5 (GAUZE/BANDAGES/DRESSINGS) IMPLANT
SPOGE SURGIFLO 8M (HEMOSTASIS) ×2
SPONGE SURGIFLO 8M (HEMOSTASIS) ×1 IMPLANT
STAPLER SKIN PROX 35W (STAPLE) IMPLANT
SUT DVC VLOC 3-0 CL 6 P-12 (SUTURE) ×3 IMPLANT
SUT NURALON 4 0 TR CR/8 (SUTURE) IMPLANT
SUT VIC AB 0 CT1 27 (SUTURE) ×2
SUT VIC AB 0 CT1 27XCR 8 STRN (SUTURE) ×1 IMPLANT
SUT VIC AB 2-0 CT1 18 (SUTURE) ×3 IMPLANT
SYR 20CC LL (SYRINGE) ×3 IMPLANT
SYR 30ML LL (SYRINGE) ×6 IMPLANT
SYR 3ML LL SCALE MARK (SYRINGE) ×3 IMPLANT
SYRINGE 10CC LL (SYRINGE) ×3 IMPLANT
TOWEL OR 17X26 4PK STRL BLUE (TOWEL DISPOSABLE) ×12 IMPLANT
TUBE METRX 18MMX5CM (INSTRUMENTS) ×3 IMPLANT
TUBING CONNECTING 10 (TUBING) ×2 IMPLANT
TUBING CONNECTING 10' (TUBING) ×1

## 2016-12-19 NOTE — Anesthesia Procedure Notes (Signed)
Procedure Name: Intubation Date/Time: 12/19/2016 2:52 PM Performed by: Nelda Marseille Pre-anesthesia Checklist: Patient identified, Patient being monitored, Timeout performed, Emergency Drugs available and Suction available Patient Re-evaluated:Patient Re-evaluated prior to induction Oxygen Delivery Method: Circle system utilized Preoxygenation: Pre-oxygenation with 100% oxygen Induction Type: IV induction Ventilation: Mask ventilation without difficulty Laryngoscope Size: Mac and 3 Grade View: Grade II Tube type: Oral Tube size: 7.5 mm Number of attempts: 1 Airway Equipment and Method: Stylet Placement Confirmation: ETT inserted through vocal cords under direct vision,  positive ETCO2 and breath sounds checked- equal and bilateral Secured at: 21 cm Tube secured with: Tape Dental Injury: Teeth and Oropharynx as per pre-operative assessment

## 2016-12-19 NOTE — Discharge Instructions (Addendum)
°Your surgeon has performed an operation on your lumbar spine (low back) to relieve pressure on one or more nerves. Many times, patients feel better immediately after surgery and can “overdo it.” Even if you feel well, it is important that you follow these activity guidelines. If you do not let your back heal properly from the surgery, you can increase the chance of a disc herniation and/or return of your symptoms. The following are instructions to help in your recovery once you have been discharged from the hospital. ° °* Do not take anti-inflammatory medications for 3 days after surgery (naproxen [Aleve], ibuprofen [Advil, Motrin], celecoxib [Celebrex], etc.) ° °Activity  °  °No bending, lifting, or twisting (“BLT”). Avoid lifting objects heavier than 10 pounds (gallon milk jug).  Where possible, avoid household activities that involve lifting, bending, pushing, or pulling such as laundry, vacuuming, grocery shopping, and childcare. Try to arrange for help from friends and family for these activities while your back heals. ° °Increase physical activity slowly as tolerated.  Taking short walks is encouraged, but avoid strenuous exercise. Do not jog, run, bicycle, lift weights, or participate in any other exercises unless specifically allowed by your doctor. Avoid prolonged sitting, including car rides. ° °Talk to your doctor before resuming sexual activity. ° °You should not drive until cleared by your doctor. ° °Until released by your doctor, you should not return to work or school.  You should rest at home and let your body heal.  ° °You may shower two days after your surgery.  After showering, lightly dab your incision dry. Do not take a tub bath or go swimming for 3 weeks, or until approved by your doctor at your follow-up appointment. ° °If you smoke, we strongly recommend that you quit.  Smoking has been proven to interfere with normal healing in your back and will dramatically reduce the success rate of  your surgery. Please contact [facility] (800-QUIT-NOW) and use the resources at www.[facility].com for assistance in stopping smoking. ° °Surgical Incision °  °If you have a dressing on your incision, you may remove it three days after your surgery. Keep your incision area clean and dry. ° °If you have staples or stitches on your incision, you should have a follow up scheduled for removal. If you do not have staples or stitches, you will have steri-strips (small pieces of surgical tape) or Dermabond glue. The steri-strips/glue should begin to peel away within about a week (it is fine if the steri-strips fall off before then). If the strips are still in place one week after your surgery, you may gently remove them. ° °Diet          ° ° You may return to your usual diet. Be sure to stay hydrated. ° °When to Contact Us ° °Although your surgery and recovery will likely be uneventful, you may have some residual numbness, aches, and pains in your back and/or legs. This is normal and should improve in the next few weeks. ° °However, should you experience any of the following, contact us immediately: °• New numbness or weakness °• Pain that is progressively getting worse, and is not relieved by your pain medications or rest °• Bleeding, redness, swelling, pain, or drainage from surgical incision °• Chills or flu-like symptoms °• Fever greater than 101.0 F (38.3 C) °• Problems with bowel or bladder functions °• Difficulty breathing or shortness of breath °• Warmth, tenderness, or swelling in your calf ° °Contact Information °• During office hours (Monday-Friday   9 am to 5 pm), please call your physician at [phone]  After hours and weekends, please call the [facility] Operator at [phone] and ask for the Neurosurgery Resident On Call   For a life-threatening emergency, call [place], Adult, Care After These instructions provide you with information about caring for yourself after your procedure. Your  health care provider may also give you more specific instructions. Your treatment has been planned according to current medical practices, but problems sometimes occur. Call your health care provider if you have any problems or questions after your procedure. What can I expect after the procedure? After the procedure, it is common to have:  Vomiting.  A sore throat.  Mental slowness.  It is common to feel:  Nauseous.  Cold or shivery.  Sleepy.  Tired.  Sore or achy, even in parts of your body where you did not have surgery.  Follow these instructions at home: For at least 24 hours after the procedure:  Do not: ? Participate in activities where you could fall or become injured. ? Drive. ? Use heavy machinery. ? Drink alcohol. ? Take sleeping pills or medicines that cause drowsiness. ? Make important decisions or sign legal documents. ? Take care of children on your own.  Rest. Eating and drinking  If you vomit, drink water, juice, or soup when you can drink without vomiting.  Drink enough fluid to keep your urine clear or pale yellow.  Make sure you have little or no nausea before eating solid foods.  Follow the diet recommended by your health care provider. General instructions  Have a responsible adult stay with you until you are awake and alert.  Return to your normal activities as told by your health care provider. Ask your health care provider what activities are safe for you.  Take over-the-counter and prescription medicines only as told by your health care provider.  If you smoke, do not smoke without supervision.  Keep all follow-up visits as told by your health care provider. This is important. Contact a health care provider if:  You continue to have nausea or vomiting at home, and medicines are not helpful.  You cannot drink fluids or start eating again.  You cannot urinate after 8-12 hours.  You develop a skin rash.  You have fever.  You  have increasing redness at the site of your procedure. Get help right away if:  You have difficulty breathing.  You have chest pain.  You have unexpected bleeding.  You feel that you are having a life-threatening or urgent problem. This information is not intended to replace advice given to you by your health care provider. Make sure you discuss any questions you have with your health care provider. Document Released: 05/21/2000 Document Revised: 07/18/2015 Document Reviewed: 01/27/2015 Elsevier Interactive Patient Education  [employer].

## 2016-12-19 NOTE — H&P (Addendum)
History of Present Illness: 12/19/16 I have confirmed the information below and have recommended surgical intervention due to the worsening of his leg pain even with conservative management.  11/15/2016 [name] is here today with a chief complaint of left then right leg pain. This started in the left leg and then got into the right leg more recently. He has had some pain in L anterior calf.  Duration: 1 month Location: R anterior thigh Quality: like lightning Severity: can be severe  Precipitating: aggravated by leaning forward, changing positions Modifying factors: made better by sitting in recliner Weakness: none Timing: all the time Bowel/Bladder Dysfunction: none  Conservative measures:  Physical therapy: none  Multimodal medical therapy including regular antiinflammatories: tried steroids, mobic  Injections: no epidural steroid injections  Past Surgery: none  Bryce Medina has no symptoms of cervical myelopathy.  The symptoms are causing a significant impact on the patient's life.   Review of Systems:  A 10 point review of systems is negative, except for the pertinent positives and negatives detailed in the HPI.  Past Medical History: Past Medical History:  Diagnosis Date  . Allergy to bee sting  . Arthritis  . Chickenpox  . Depression, unspecified  . Gout, joint  . Hemorrhoids  . History of kidney stones  . Psoriasis, unspecified   Past Surgical History: Past Surgical History:  Procedure Laterality Date  . APPENDECTOMY 2012  . HERNIA REPAIR  x2, in 1987 and 1988.  . Surgery for [facility] joint separation left shoulder 2006   Allergies: Allergies as of 11/15/2016 - Reviewed 11/15/2016  Allergen Reaction Noted  . 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 EPINEPHrine (EPIPEN) 0.3 mg/0.3 mL pen injector Inject 0.3 mg into the muscle once as needed for  Anaphylaxis.  . fluticasone (FLONASE) 50 mcg/actuation nasal spray Place 2 sprays into both nostrils once daily.  Marland Kitchen levocetirizine (XYZAL) 5 MG tablet Take 5 mg by mouth every evening.  Marland Kitchen omeprazole (PRILOSEC) 40 MG DR capsule Take 40 mg by mouth 2 (two) times daily.  Marland Kitchen POTASSIUM CITRATE ORAL Take by mouth 2 (two) times daily.  . [DISCONTINUED] meloxicam (MOBIC) 15 MG tablet Take 1 tablet (15 mg total) by mouth once daily as needed. 30 tablet 0   No facility-administered encounter medications on file as of 11/15/2016.   Social History: Social History  Substance Use Topics  . Smoking status: Never Smoker  . Smokeless tobacco: Never Used  . Alcohol use No   Family Medical History: Family History  Problem Relation Age of Onset  . Other Mother  Pre-diabetes.  Marland Kitchen Heart murmur Sister   Physical Examination:  Vitals:   12/19/16 1320  BP: (!) 160/106  Pulse: 78  Resp: 16  Temp: 98 F (36.7 C)  SpO2: 100%     General: Patient is well developed, well nourished, calm, collected, and in no apparent distress. Attention to examination is appropriate.  Psychiatric: Patient is non-anxious.  Head: Pupils equal, round, and reactive to light.  ENT: Oral mucosa appears well hydrated.  Neck: Supple. Full range of motion.  Respiratory: Patient is breathing without any difficulty.  Extremities: No edema.  Vascular: Palpable dorsal pedal pulses.  Skin: On exposed skin, there are no abnormal skin lesions.  Heart sounds normal no MRG. Chest Clear to Auscultation Bilaterally.   NEUROLOGICAL:  General: In no acute distress.  Awake, alert, oriented to person, place, and time. Speech  is clear and fluent. Fund of knowledge is appropriate.   Cranial Nerves: Pupils equal round and reactive to light. Facial tone is symmetric. Facial sensation is symmetric. Shoulder shrug is symmetric. Tongue protrusion is midline. There is no pronator drift.  ROM of spine: full. Palpation of spine: non  tender.   Strength: Side Biceps Triceps Deltoid Interossei Grip Wrist Ext. Wrist Flex.  R 5 5 5 5 5 5 5   L 5 5 5 5 5 5 5    Side Iliopsoas Quads Hamstring PF DF EHL  R 5 5 5 5 5 5   L 5 5 5 5 5 5    Reflexes are 1+ and symmetric at the biceps, triceps, brachioradialis, patella and achilles. Bilateral upper and lower extremity sensation is intact to light touch. Clonus is not present. Toes are down-going. Gait is abnormal, shuffling, and antalgic. Hoffman's is absent.  Rapid alternating movements are normal.   Medical Decision Making  Imaging: MRI L spine 11/07/16 IMPRESSION: Worsening spondylosis at L3-4 since the prior examination. New synovial cyst off the medial margin of the right facet joint, shallow disc bulge and ligamentum flavum thickening cause moderate to moderately severe central canal stenosis and severe narrowing in the right subarticular recess with impingement on the descending right L4 root.  Improved appearance of L5-S1 where a left subarticular recess protrusion has resolved since the prior MRI. The central canal and foramina are open.  Improved appearance of L2-3 where a shallow disc bulge is decreased in size. There is mild central canal narrowing at L2-3 without nerve root compression.  Electronically Signed ByAlphonse Guild M.D. On: 11/07/2016 22:51  I have personally reviewed the images and agree with the above interpretation.  Assessment and Plan: Bryce Medina is a pleasant 51 y.o. 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 He has failed conservative management.   We will proceed with R MIS L3-4 synovial cyst resection.  Risks reviewed.  Colman Birdwell K. Izora Ribas MD, [place] [facility]

## 2016-12-19 NOTE — Anesthesia Post-op Follow-up Note (Signed)
Anesthesia QCDR form completed.        
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
## 2016-12-19 NOTE — Op Note (Signed)
Indications: [name] is a 51 yo male who presented with lumbar radiculopathy secondary to R L3-4 synovial cyst.  He failed conservative management and asked that we proceed with surgical intervention.  Findings: severe lumbar stenosis secondary to synovial cyst  Preoperative Diagnosis: Lumbar Radiculopathy Postoperative Diagnosis: same   EBL: 25 ml IVF: 800 ml Drains: none Disposition: Extubated and Stable to PACU Complications: none  No foley catheter was placed.   Preoperative Note:   Risks of surgery discussed include: infection, bleeding, stroke, coma, death, paralysis, CSF leak, nerve/spinal cord injury, numbness, tingling, weakness, complex regional pain syndrome, recurrent stenosis and/or disc herniation, vascular injury, development of instability, neck/back pain, need for further surgery, persistent symptoms, development of deformity, and the risks of anesthesia. They understood these risks and have agreed to proceed.  Operative Note:   1. L3-4 synovial cyst resection via minimally invasive approach including central laminectomy and bilateral medial facetectomies including foraminotomies  The patient was then brought from the preoperative center with intravenous access established.  The patient underwent general anesthesia and endotracheal tube intubation, and was then rotated on the [place] rail top where all pressure points were appropriately padded.  The skin was then thoroughly cleansed.  Perioperative antibiotic prophylaxis was administered.  Sterile prep and drapes were then applied and a timeout was then observed.  C-arm was brought into the field under sterile conditions and under lateral visualization the L3-4 interspace was identified and marked.  The incision was marked on the right and injected with local anesthetic. Once this was complete a 2 cm incision was opened with the use of a #10 blade knife.  The metrx tubes were sequentially advanced and confirmed in  position. An 33mm by 50 mm tube was locked in place to the bed side attachment.  Fluoroscopy was then removed from the field.  The microscope was then sterilely brought into the field and muscle creep was hemostased with a bipolar and resected with a pituitary rongeur.  A Bovie extender was then used to expose the spinous process and lamina.  Careful attention was placed to not violate the facet capsule. A 3 mm matchstick drill bit was then used to make a hemi-laminotomy trough until the ligamentum flavum was exposed.  This was extended to the base of the spinous process and to the contralateral side to remove all the central bone from each side.  Once this was complete and the underlying ligamentum flavum was visualized, it was dissected with a curette and resected with Kerrison rongeurs.  Extensive ligamentum hypertrophy was noted, requiring a substantial amount of time and care for removal.  On the right side, a large synovial cyst was noted to be causing the majority of the compression on the neural elements.  This was carefully dissected from the dura then resected until no compression of the dural sac or R L4 nerve root was noted.  The dura was identified and palpated. The kerrison rongeur was then used to remove the medial facet bilaterally until no compression was noted.  A balltip probe was used to confirm decompression of the right L4 nerve root.  Additional attention was paid to completion of the contralateral L3-4 foraminotomy until the left L4 nerve root was completely free.  Once this was complete, L3-4 central decompression including medial facetectomy and foraminotomy was confirmed and decompression on both sides was confirmed. No remaining synovial cyst was visualized. No CSF leak was noted.  The wound was copiously irrigated. The tube system was then removed under  microscopic visualization and hemostasis was obtained with a bipolar.  The fascial layer was reapproximated with the use of a 0  Vicryl suture.  Subcutaneous tissue layer was reapproximated using 2-0 Vicryl suture.  3-0 monocryl was placed in subcuticular fashion. The skin was then cleansed and Dermabond was used to close the skin opening.  Patient was then rotated back to the preoperative bed awakened from anesthesia and taken to recovery all counts are correct in this case.  I performed the entire procedure with the assistance of Bryce Olp PA as an Pensions consultant.  Bryce Olden K. Izora Ribas MD

## 2016-12-19 NOTE — Transfer of Care (Signed)
Immediate Anesthesia Transfer of Care Note  Patient: Bryce Medina  Procedure(s) Performed: LUMBAR LAMINECTOMY/DECOMPRESSION MICRODISCECTOMY 1 LEVEL-L3-4- SYNOVIAL CYST RESECTION (N/A Spine Lumbar)  Patient Location: PACU  Anesthesia Type:General  Level of Consciousness: sedated  Airway & Oxygen Therapy: Patient Spontanous Breathing and Patient connected to face mask oxygen  Post-op Assessment: Report given to RN and Post -op Vital signs reviewed and stable  Post vital signs: Reviewed and stable  Last Vitals:  Vitals:   12/19/16 1320  BP: (!) 160/106  Pulse: 78  Resp: 16  Temp: 36.7 C  SpO2: 100%    Last Pain:  Vitals:   12/19/16 1320  TempSrc: Tympanic  PainSc: 6          Complications: No apparent anesthesia complications

## 2016-12-19 NOTE — Anesthesia Postprocedure Evaluation (Signed)
Anesthesia Post Note  Patient: Bryce Medina  Procedure(s) Performed: LUMBAR LAMINECTOMY/DECOMPRESSION MICRODISCECTOMY 1 LEVEL-L3-4- SYNOVIAL CYST RESECTION (N/A Spine Lumbar)  Patient location during evaluation: PACU Anesthesia Type: General Level of consciousness: awake and alert Pain management: pain level controlled Vital Signs Assessment: post-procedure vital signs reviewed and stable Respiratory status: spontaneous breathing, nonlabored ventilation, respiratory function stable and patient connected to nasal cannula oxygen Cardiovascular status: blood pressure returned to baseline and stable Postop Assessment: no apparent nausea or vomiting Anesthetic complications: no     Last Vitals:  Vitals:   12/19/16 1711 12/19/16 1726  BP:  (!) 131/93  Pulse: (!) 107 (!) 118  Resp: 15 14  Temp:  (!) 36.4 C  SpO2: 91% 100%    Last Pain:  Vitals:   12/19/16 1726  TempSrc: Temporal  PainSc: 0-No pain                 Precious Haws Piscitello

## 2016-12-20 ENCOUNTER — Encounter: Payer: Self-pay | Admitting: Neurosurgery

## 2017-10-22 DIAGNOSIS — J302 Other seasonal allergic rhinitis: Secondary | ICD-10-CM | POA: Insufficient documentation

## 2017-10-22 DIAGNOSIS — K219 Gastro-esophageal reflux disease without esophagitis: Secondary | ICD-10-CM | POA: Insufficient documentation

## 2017-11-26 ENCOUNTER — Encounter (INDEPENDENT_AMBULATORY_CARE_PROVIDER_SITE_OTHER): Payer: BLUE CROSS/BLUE SHIELD | Admitting: Ophthalmology

## 2018-06-26 DIAGNOSIS — K219 Gastro-esophageal reflux disease without esophagitis: Secondary | ICD-10-CM | POA: Insufficient documentation

## 2018-09-02 ENCOUNTER — Encounter: Payer: Self-pay | Admitting: Emergency Medicine

## 2018-09-02 ENCOUNTER — Emergency Department
Admission: EM | Admit: 2018-09-02 | Discharge: 2018-09-02 | Disposition: A | Discharge status: Home / Self Care | Payer: BC Managed Care – PPO | Attending: Emergency Medicine | Admitting: Emergency Medicine

## 2018-09-02 ENCOUNTER — Other Ambulatory Visit: Payer: Self-pay

## 2018-09-02 ENCOUNTER — Emergency Department: Payer: BC Managed Care – PPO

## 2018-09-02 DIAGNOSIS — R109 Unspecified abdominal pain: Secondary | ICD-10-CM | POA: Diagnosis present

## 2018-09-02 DIAGNOSIS — N23 Unspecified renal colic: Secondary | ICD-10-CM | POA: Insufficient documentation

## 2018-09-02 DIAGNOSIS — M7918 Myalgia, other site: Secondary | ICD-10-CM | POA: Insufficient documentation

## 2018-09-02 DIAGNOSIS — Z79899 Other long term (current) drug therapy: Secondary | ICD-10-CM | POA: Diagnosis not present

## 2018-09-02 DIAGNOSIS — Z20828 Contact with and (suspected) exposure to other viral communicable diseases: Secondary | ICD-10-CM | POA: Insufficient documentation

## 2018-09-02 DIAGNOSIS — R05 Cough: Secondary | ICD-10-CM | POA: Diagnosis not present

## 2018-09-02 LAB — COMPREHENSIVE METABOLIC PANEL
ALT: 31 U/L (ref 0–44)
AST: 23 U/L (ref 15–41)
Albumin: 4.1 g/dL (ref 3.5–5.0)
Alkaline Phosphatase: 87 U/L (ref 38–126)
Anion gap: 12 (ref 5–15)
BUN: 20 mg/dL (ref 6–20)
CO2: 27 mmol/L (ref 22–32)
Calcium: 9.5 mg/dL (ref 8.9–10.3)
Chloride: 103 mmol/L (ref 98–111)
Creatinine, Ser: 1.86 mg/dL — ABNORMAL HIGH (ref 0.61–1.24)
GFR calc Af Amer: 48 mL/min — ABNORMAL LOW (ref >60)
GFR calc non Af Amer: 41 mL/min — ABNORMAL LOW (ref >60)
Glucose, Bld: 115 mg/dL — ABNORMAL HIGH (ref 70–99)
Potassium: 4.3 mmol/L (ref 3.5–5.1)
Sodium: 142 mmol/L (ref 135–145)
Total Bilirubin: 0.8 mg/dL (ref 0.3–1.2)
Total Protein: 7.7 g/dL (ref 6.5–8.1)

## 2018-09-02 LAB — CBC
HCT: 40.2 % (ref 39.0–52.0)
Hemoglobin: 13.3 g/dL (ref 13.0–17.0)
MCH: 29.5 pg (ref 26.0–34.0)
MCHC: 33.1 g/dL (ref 30.0–36.0)
MCV: 89.1 fL (ref 80.0–100.0)
Platelets: 324 10*3/uL (ref 150–400)
RBC: 4.51 MIL/uL (ref 4.22–5.81)
RDW: 12.6 % (ref 11.5–15.5)
WBC: 14.6 10*3/uL — ABNORMAL HIGH (ref 4.0–10.5)
nRBC: 0 % (ref 0.0–0.2)

## 2018-09-02 LAB — URINALYSIS, COMPLETE (UACMP) WITH MICROSCOPIC
Bacteria, UA: NONE SEEN
Bilirubin Urine: NEGATIVE
Glucose, UA: NEGATIVE mg/dL
Hgb urine dipstick: NEGATIVE
Ketones, ur: 20 mg/dL — AB
Leukocytes,Ua: NEGATIVE
Nitrite: NEGATIVE
Protein, ur: 30 mg/dL — AB
Specific Gravity, Urine: 1.026 (ref 1.005–1.030)
pH: 5 (ref 5.0–8.0)

## 2018-09-02 LAB — LIPASE, BLOOD: Lipase: 27 U/L (ref 11–51)

## 2018-09-02 MED ORDER — SODIUM CHLORIDE 0.9% FLUSH: 3.0000 mL | Freq: Once | INTRAVENOUS | Status: DC
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
## 2018-09-02 NOTE — ED Triage Notes (Signed)
Says he has been dealing with a left side kidney stone.  Says during that he ended up getting constipated.  Took senocot and eventually mirilax and did move bowels, but he continues to have pain left side abdomen. He did go to [facility] for fever and they gave him a shot of antibiotics and put him on 10 days of cipro.  Says doesn't feel well either and has some nausea.  Fever at hime 99.1-101.

## 2018-09-02 NOTE — Discharge Instructions (Addendum)
Please call Dr. Erlene Quan in the morning.  Let her know that you were seen in the emergency room.  She should be able to get you in the office tomorrow.  Please return here for worse pain fever vomiting or feeling sicker.  Continue to use the medicines your doctor gave you for the time being.

## 2018-09-03 ENCOUNTER — Ambulatory Visit (INDEPENDENT_AMBULATORY_CARE_PROVIDER_SITE_OTHER): Payer: BC Managed Care – PPO | Admitting: Urology

## 2018-09-03 ENCOUNTER — Other Ambulatory Visit: Payer: Self-pay | Admitting: Radiology

## 2018-09-03 ENCOUNTER — Encounter: Payer: Self-pay | Admitting: Urology

## 2018-09-03 ENCOUNTER — Ambulatory Visit
Admission: RE | Admit: 2018-09-03 | Discharge: 2018-09-03 | Disposition: A | Discharge status: Home / Self Care | Payer: BC Managed Care – PPO | Source: Ambulatory Visit | Attending: Urology | Admitting: Urology

## 2018-09-03 VITALS — BP 147/94 | HR 90 | Ht 71.0 in | Wt 185.0 lb

## 2018-09-03 DIAGNOSIS — N132 Hydronephrosis with renal and ureteral calculous obstruction: Secondary | ICD-10-CM

## 2018-09-03 DIAGNOSIS — K649 Unspecified hemorrhoids: Secondary | ICD-10-CM | POA: Insufficient documentation

## 2018-09-03 DIAGNOSIS — N200 Calculus of kidney: Secondary | ICD-10-CM

## 2018-09-03 DIAGNOSIS — N201 Calculus of ureter: Secondary | ICD-10-CM

## 2018-09-03 MED ORDER — ONDANSETRON HCL 4 MG PO TABS: 4.0000 mg | ORAL_TABLET | Freq: Three times a day (TID) | ORAL | 1 refills | Status: DC | PRN
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
## 2018-09-04 ENCOUNTER — Ambulatory Visit
Admission: RE | Admit: 2018-09-04 | Discharge: 2018-09-04 | Disposition: A | Discharge status: Home / Self Care | Payer: BC Managed Care – PPO | Attending: Urology | Admitting: Urology

## 2018-09-04 ENCOUNTER — Other Ambulatory Visit: Payer: Self-pay

## 2018-09-04 ENCOUNTER — Encounter
Admission: RE | Disposition: A | Discharge status: Home / Self Care | Payer: Self-pay | Source: Home / Self Care | Attending: Urology

## 2018-09-04 ENCOUNTER — Ambulatory Visit: Payer: BC Managed Care – PPO

## 2018-09-04 ENCOUNTER — Encounter: Payer: Self-pay | Admitting: *Deleted

## 2018-09-04 DIAGNOSIS — N132 Hydronephrosis with renal and ureteral calculous obstruction: Secondary | ICD-10-CM | POA: Diagnosis not present

## 2018-09-04 DIAGNOSIS — K219 Gastro-esophageal reflux disease without esophagitis: Secondary | ICD-10-CM | POA: Diagnosis not present

## 2018-09-04 DIAGNOSIS — Z79899 Other long term (current) drug therapy: Secondary | ICD-10-CM | POA: Insufficient documentation

## 2018-09-04 DIAGNOSIS — N201 Calculus of ureter: Secondary | ICD-10-CM

## 2018-09-04 DIAGNOSIS — Z87442 Personal history of urinary calculi: Secondary | ICD-10-CM | POA: Insufficient documentation

## 2018-09-04 DIAGNOSIS — N200 Calculus of kidney: Secondary | ICD-10-CM

## 2018-09-04 HISTORY — PX: EXTRACORPOREAL SHOCK WAVE LITHOTRIPSY: SHX1557

## 2018-09-04 LAB — NOVEL CORONAVIRUS, NAA (HOSP ORDER, SEND-OUT TO REF LAB; TAT 18-24 HRS): SARS-CoV-2, NAA: NOT DETECTED

## 2018-09-04 SURGERY — LITHOTRIPSY, ESWL
Anesthesia: Moderate Sedation | Laterality: Left

## 2018-09-04 MED ORDER — DIPHENHYDRAMINE HCL 25 MG PO CAPS
ORAL_CAPSULE | ORAL | Status: AC
  Administered 2018-09-04: 11:00:00 25 mg via ORAL
  Filled 2018-09-04: qty 1

## 2018-09-04 MED ORDER — DIAZEPAM 5 MG PO TABS
ORAL_TABLET | ORAL | Status: AC
  Administered 2018-09-04: 11:00:00 10 mg via ORAL
  Filled 2018-09-04: qty 2

## 2018-09-04 MED ORDER — CIPROFLOXACIN HCL 500 MG PO TABS
500.0000 mg | ORAL_TABLET | ORAL | Status: AC
  Administered 2018-09-04: 11:00:00 500 mg via ORAL

## 2018-09-04 MED ORDER — ONDANSETRON HCL 4 MG/2ML IJ SOLN
INTRAMUSCULAR | Status: AC
  Administered 2018-09-04: 11:00:00 4 mg via INTRAVENOUS
  Filled 2018-09-04: qty 2

## 2018-09-04 MED ORDER — HYDROCODONE-ACETAMINOPHEN 5-325 MG PO TABS: 1.0000 | ORAL_TABLET | ORAL | 0 refills | Status: DC | PRN

## 2018-09-04 MED ORDER — DIPHENHYDRAMINE HCL 25 MG PO CAPS
25.0000 mg | ORAL_CAPSULE | ORAL | Status: AC
  Administered 2018-09-04: 11:00:00 25 mg via ORAL

## 2018-09-04 MED ORDER — TAMSULOSIN HCL 0.4 MG PO CAPS: 0.4000 mg | ORAL_CAPSULE | Freq: Every day | ORAL | 0 refills | Status: DC

## 2018-09-04 MED ORDER — CIPROFLOXACIN HCL 500 MG PO TABS
ORAL_TABLET | ORAL | Status: AC
  Administered 2018-09-04: 11:00:00 500 mg via ORAL
  Filled 2018-09-04: qty 1

## 2018-09-04 MED ORDER — ONDANSETRON HCL 4 MG/2ML IJ SOLN
4.0000 mg | Freq: Once | INTRAMUSCULAR | Status: AC | PRN
  Administered 2018-09-04: 11:00:00 4 mg via INTRAVENOUS

## 2018-09-04 MED ORDER — DIAZEPAM 5 MG PO TABS: 10.0000 mg | ORAL_TABLET | ORAL | Status: DC

## 2018-09-04 MED ORDER — DIAZEPAM 5 MG PO TABS
10.0000 mg | ORAL_TABLET | Freq: Once | ORAL | Status: AC
  Administered 2018-09-04: 11:00:00 10 mg via ORAL

## 2018-09-04 MED ORDER — SODIUM CHLORIDE 0.9 % IV SOLN: INTRAVENOUS | Status: DC

## 2018-09-04 MED ORDER — ONDANSETRON HCL 2 MG/ML IV SOLN: 4.0000 mg | Freq: Once | INTRAVENOUS | Status: DC | PRN

## 2018-09-04 NOTE — H&P (Addendum)
UROLOGY H&P UPDATE  Agree with prior H&P dated 09/03/18. 53 yo M with uncontrolled pain 2/2 left 4mm proximal ureteral stone. Clearly seen on KUB today, location unchanged. He has not tolerated stents well previously.  Enterococcus in urine 08/29/18(urinalysis at that time was benign appearing, + squamous cells, but culture ultimately grew 100k enterococcus), treated by PCP with culture appropriate cipro. KUB at that time reportedly did not show any calcifications over the ureter per report, however I am unable to personally review the film as it is in outside system. Repeat urinalysis 09/02/18 benign with 0-5 WBCs, no bacteria, nitrite negative. He denies fevers, chills, dysuria today.  Cardiac: RRR Lungs: CTA bilaterally  Laterality: LEFT Procedure: SWL  Urine: urinalysis 7/7: 0-5 WBCs, no bacteria, nitrite negative  Informed consent obtained, we specifically discussed the risks of bleeding, infection/sepsis, post-operative pain, need for additional procedures, steinstrasse.   Billey Co, MD 09/04/2018

## 2018-09-05 ENCOUNTER — Encounter: Payer: Self-pay | Admitting: Urology

## 2018-09-06 LAB — CULTURE, URINE COMPREHENSIVE
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
## 2018-09-19 ENCOUNTER — Other Ambulatory Visit: Payer: Self-pay

## 2018-09-19 ENCOUNTER — Ambulatory Visit
Admission: RE | Admit: 2018-09-19 | Discharge: 2018-09-19 | Disposition: A | Discharge status: Home / Self Care | Payer: BC Managed Care – PPO | Source: Ambulatory Visit | Attending: Urology | Admitting: Urology

## 2018-09-19 ENCOUNTER — Ambulatory Visit: Payer: BC Managed Care – PPO | Admitting: Urology

## 2018-09-19 ENCOUNTER — Other Ambulatory Visit: Payer: Self-pay | Admitting: Family Medicine

## 2018-09-19 ENCOUNTER — Encounter: Payer: Self-pay | Admitting: Urology

## 2018-09-19 VITALS — BP 150/91 | HR 85 | Wt 188.0 lb

## 2018-09-19 DIAGNOSIS — N200 Calculus of kidney: Secondary | ICD-10-CM | POA: Diagnosis not present

## 2018-09-19 DIAGNOSIS — N132 Hydronephrosis with renal and ureteral calculous obstruction: Secondary | ICD-10-CM

## 2018-09-19 DIAGNOSIS — N201 Calculus of ureter: Secondary | ICD-10-CM

## 2018-09-23 ENCOUNTER — Other Ambulatory Visit: Payer: Self-pay | Admitting: Family Medicine

## 2018-09-23 DIAGNOSIS — N200 Calculus of kidney: Secondary | ICD-10-CM
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
## 2018-10-01 ENCOUNTER — Encounter: Payer: Self-pay | Admitting: Urology

## 2018-10-01 ENCOUNTER — Ambulatory Visit: Payer: BC Managed Care – PPO | Admitting: Urology

## 2018-10-01 ENCOUNTER — Ambulatory Visit
Admission: RE | Admit: 2018-10-01 | Discharge: 2018-10-01 | Disposition: A | Discharge status: Home / Self Care | Payer: BC Managed Care – PPO | Source: Ambulatory Visit | Attending: Urology | Admitting: Urology

## 2018-10-01 ENCOUNTER — Other Ambulatory Visit: Payer: Self-pay

## 2018-10-01 VITALS — BP 144/95 | HR 79 | Ht 71.0 in | Wt 187.0 lb

## 2018-10-01 DIAGNOSIS — R351 Nocturia: Secondary | ICD-10-CM

## 2018-10-01 DIAGNOSIS — N200 Calculus of kidney: Secondary | ICD-10-CM | POA: Diagnosis not present

## 2018-10-01 DIAGNOSIS — N201 Calculus of ureter: Secondary | ICD-10-CM

## 2018-10-01 DIAGNOSIS — N132 Hydronephrosis with renal and ureteral calculous obstruction: Secondary | ICD-10-CM | POA: Diagnosis not present

## 2018-10-01 LAB — BLADDER SCAN AMB NON-IMAGING

## 2018-10-01 MED ORDER — TADALAFIL 5 MG PO TABS: 5.0000 mg | ORAL_TABLET | Freq: Every day | ORAL | 3 refills | Status: DC | PRN

## 2018-10-15 ENCOUNTER — Other Ambulatory Visit: Payer: Self-pay

## 2018-10-15 ENCOUNTER — Ambulatory Visit
Admission: RE | Admit: 2018-10-15 | Discharge: 2018-10-15 | Disposition: A | Discharge status: Home / Self Care | Payer: BC Managed Care – PPO | Source: Ambulatory Visit | Attending: Urology | Admitting: Urology

## 2018-10-15 DIAGNOSIS — N200 Calculus of kidney: Secondary | ICD-10-CM | POA: Insufficient documentation

## 2018-10-15 DIAGNOSIS — N201 Calculus of ureter: Secondary | ICD-10-CM | POA: Insufficient documentation

## 2018-10-15 DIAGNOSIS — N132 Hydronephrosis with renal and ureteral calculous obstruction: Secondary | ICD-10-CM | POA: Insufficient documentation
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
## 2018-11-04 ENCOUNTER — Other Ambulatory Visit: Payer: Self-pay

## 2018-11-04 ENCOUNTER — Encounter: Payer: Self-pay | Admitting: Urology

## 2018-11-04 ENCOUNTER — Ambulatory Visit (INDEPENDENT_AMBULATORY_CARE_PROVIDER_SITE_OTHER): Payer: BC Managed Care – PPO | Admitting: Urology

## 2018-11-04 VITALS — BP 158/99 | HR 75 | Ht 71.0 in | Wt 185.0 lb

## 2018-11-04 DIAGNOSIS — R351 Nocturia: Secondary | ICD-10-CM | POA: Diagnosis not present

## 2018-11-04 DIAGNOSIS — N200 Calculus of kidney: Secondary | ICD-10-CM

## 2018-11-04 LAB — BLADDER SCAN AMB NON-IMAGING: Scan Result: 12

## 2018-11-11 ENCOUNTER — Other Ambulatory Visit: Payer: Self-pay

## 2018-11-11 DIAGNOSIS — R351 Nocturia: Secondary | ICD-10-CM

## 2018-11-12 ENCOUNTER — Other Ambulatory Visit: Payer: Self-pay

## 2018-11-12 ENCOUNTER — Other Ambulatory Visit: Payer: BC Managed Care – PPO

## 2018-11-12 DIAGNOSIS — R351 Nocturia: Secondary | ICD-10-CM

## 2018-11-13 LAB — SODIUM: Sodium: 141 mmol/L (ref 134–144)

## 2018-11-14 ENCOUNTER — Telehealth: Payer: Self-pay

## 2018-11-14 DIAGNOSIS — N401 Enlarged prostate with lower urinary tract symptoms: Secondary | ICD-10-CM

## 2018-11-14 MED ORDER — TADALAFIL 5 MG PO TABS: 5.0000 mg | ORAL_TABLET | Freq: Every day | ORAL | 3 refills | Status: DC

## 2018-11-14 NOTE — Telephone Encounter (Signed)
RX sent, per Larene Beach (see mychart message)

## 2019-04-02 ENCOUNTER — Other Ambulatory Visit: Payer: Self-pay | Admitting: Urology

## 2019-05-13 ENCOUNTER — Other Ambulatory Visit: Payer: Self-pay | Admitting: Dermatology

## 2019-05-14 NOTE — Telephone Encounter (Signed)
See request °

## 2019-05-27 ENCOUNTER — Encounter: Payer: Self-pay | Admitting: Dermatology

## 2019-05-27 ENCOUNTER — Ambulatory Visit: Payer: BLUE CROSS/BLUE SHIELD | Admitting: Dermatology

## 2019-05-27 ENCOUNTER — Other Ambulatory Visit: Payer: Self-pay

## 2019-05-27 DIAGNOSIS — B353 Tinea pedis: Secondary | ICD-10-CM | POA: Diagnosis not present

## 2019-05-27 DIAGNOSIS — Z1283 Encounter for screening for malignant neoplasm of skin: Secondary | ICD-10-CM

## 2019-05-27 DIAGNOSIS — D492 Neoplasm of unspecified behavior of bone, soft tissue, and skin: Secondary | ICD-10-CM

## 2019-05-27 DIAGNOSIS — L409 Psoriasis, unspecified: Secondary | ICD-10-CM | POA: Diagnosis not present

## 2019-05-27 DIAGNOSIS — D485 Neoplasm of uncertain behavior of skin: Secondary | ICD-10-CM | POA: Diagnosis not present

## 2019-05-27 LAB — POCT SKIN KOH: Skin KOH, POC: NEGATIVE

## 2019-05-27 MED ORDER — CALCIPOTRIENE-BETAMETH DIPROP 0.005-0.064 % EX SUSP: 1.0000 "application " | Freq: Every day | CUTANEOUS | 3 refills | Status: DC

## 2019-05-27 MED ORDER — ENSTILAR 0.005-0.064 % EX FOAM: 1.0000 "application " | Freq: Every day | CUTANEOUS | 3 refills | Status: DC

## 2019-05-27 NOTE — Patient Instructions (Signed)

## 2019-05-31 ENCOUNTER — Encounter: Payer: Self-pay | Admitting: Dermatology

## 2019-06-26 LAB — CULT, FUNGUS, SKIN,HAIR,NAIL W/KOH
CULTURE:: NO GROWTH
MICRO NUMBER:: 10322138
SPECIMEN QUALITY:: ADEQUATE

## 2019-07-10 NOTE — Progress Notes (Signed)
   Follow-Up Visit   Subjective  Bryce Medina is a 54 y.o. male who presents for the following: Annual Exam (Has a place on his left inner cheek that he hits while shaving.) and Psoriasis (Doing well.  Elbows right outer foot and left shin seem to stay flared.  Using Enstilar and Taclonex scalp solution, needs refills.  Has noticed some new joint stiffness in the morning.).  Growth Location: Right shoulder Duration: Several months Quality: Larger Associated Signs/Symptoms: Modifying Factors:  Severity:  Timing: Context:   The following portions of the chart were reviewed this encounter and updated as appropriate: Tobacco  Allergies  Meds  Problems  Med Hx  Surg Hx  Fam Hx      Objective  Well appearing patient in no apparent distress; mood and affect are within normal limits.  A full examination was performed including scalp, head, eyes, ears, nose, lips, neck, chest, axillae, abdomen, back, buttocks, bilateral upper extremities, bilateral lower extremities, hands, feet, fingers, toes, fingernails, and toenails. All findings within normal limits unless otherwise noted below.   Assessment & Plan  Neoplasm of skin (2) Left Buccal Cheek   Skin / nail biopsy Type of biopsy: tangential   Informed consent: discussed and consent obtained   Instrument used: flexible razor blade   Hemostasis achieved with: ferric subsulfate   Outcome: patient tolerated procedure well   Post-procedure details: sterile dressing applied and wound care instructions given   Dressing type: bandage and petrolatum    Specimen 1 - Surgical pathology Differential Diagnosis: BCC vs SCC Check Margins: No  Right shoulder  Skin / nail biopsy Type of biopsy: tangential   Informed consent: discussed and consent obtained   Instrument used: flexible razor blade   Hemostasis achieved with: ferric subsulfate   Post-procedure details: sterile dressing applied and wound care instructions given     Dressing type: bandage and petrolatum   Additional details:  Cautery only  Specimen 2 - Surgical pathology Differential Diagnosis: ISK vs NMSC Check Margins: No  Psoriasis Left Lower Leg - Anterior  Calcipotriene-Betameth Diprop (ENSTILAR) 0.005-0.064 % FOAM - Left Lower Leg - Anterior  Tinea pedis of right foot Right Dorsum of Foot  Culture, Fungus with Smear - Right Dorsum of Foot  POCT Skin KOH - Right Dorsum of Foot  Other Related Procedures Cult, Fungus, Skin,Hair,Nail w/KOH

## 2019-12-08 ENCOUNTER — Other Ambulatory Visit: Payer: Self-pay | Admitting: Urology

## 2019-12-08 DIAGNOSIS — R351 Nocturia: Secondary | ICD-10-CM

## 2019-12-08 DIAGNOSIS — N401 Enlarged prostate with lower urinary tract symptoms: Secondary | ICD-10-CM

## 2020-01-14 ENCOUNTER — Other Ambulatory Visit: Payer: Self-pay | Admitting: Dermatology

## 2020-01-14 MED ORDER — CALCIPOTRIENE-BETAMETH DIPROP 0.005-0.064 % EX SUSP: 1.0000 "application " | Freq: Every day | CUTANEOUS | 3 refills | Status: DC

## 2020-01-14 NOTE — Telephone Encounter (Signed)
Sent in refill Taclonex to [facility]. Patient notified.

## 2020-01-14 NOTE — Telephone Encounter (Signed)
Needs refill of Taclonex sent to [facility] in [place] ([address]) previous pharmacy it was sent to is no longer open.

## 2020-05-28 ENCOUNTER — Other Ambulatory Visit: Payer: Self-pay | Admitting: Dermatology

## 2020-05-28 DIAGNOSIS — L409 Psoriasis, unspecified: Secondary | ICD-10-CM

## 2020-05-31 ENCOUNTER — Other Ambulatory Visit: Payer: Self-pay

## 2020-05-31 ENCOUNTER — Ambulatory Visit: Payer: BLUE CROSS/BLUE SHIELD | Admitting: Dermatology

## 2020-05-31 ENCOUNTER — Encounter: Payer: Self-pay | Admitting: Dermatology

## 2020-05-31 DIAGNOSIS — Z1283 Encounter for screening for malignant neoplasm of skin: Secondary | ICD-10-CM

## 2020-05-31 DIAGNOSIS — B353 Tinea pedis: Secondary | ICD-10-CM

## 2020-05-31 DIAGNOSIS — L578 Other skin changes due to chronic exposure to nonionizing radiation: Secondary | ICD-10-CM

## 2020-05-31 DIAGNOSIS — L409 Psoriasis, unspecified: Secondary | ICD-10-CM | POA: Diagnosis not present

## 2020-05-31 LAB — POCT SKIN KOH: Skin KOH, POC: NEGATIVE

## 2020-05-31 MED ORDER — TACROLIMUS 0.1 % EX OINT: TOPICAL_OINTMENT | CUTANEOUS | 0 refills | Status: DC

## 2020-05-31 MED ORDER — TACROLIMUS 0.1 % EX OINT: TOPICAL_OINTMENT | CUTANEOUS | 2 refills | Status: DC

## 2020-05-31 MED ORDER — ENSTILAR 0.005-0.064 % EX FOAM: 1.0000 "application " | Freq: Every day | CUTANEOUS | 11 refills | Status: DC

## 2020-05-31 MED ORDER — CALCIPOTRIENE-BETAMETH DIPROP 0.005-0.064 % EX SUSP: 1.0000 | Freq: Every day | CUTANEOUS | 11 refills | Status: DC

## 2020-06-13 ENCOUNTER — Encounter: Payer: Self-pay | Admitting: Dermatology

## 2020-06-13 NOTE — Progress Notes (Signed)
   Follow-Up Visit   Subjective  Bryce Medina is a 55 y.o. male who presents for the following: Psoriasis (All over flare x weeks per patient treatment is currently enstilar foam and talconex for scalp. ).  General skin check plus discuss options for psoriasis Location:  Duration:  Quality:  Associated Signs/Symptoms: Modifying Factors:  Severity:  Timing: Context:   Objective  Well appearing patient in no apparent distress; mood and affect are within normal limits. Objective  Waist Up: No atypical pigmented lesions or nonmelanoma skin cancer  Objective  Dorsal Penile Shaft, Left Elbow - Posterior, Left Lower Leg - Anterior, Right Elbow - Posterior, Scalp: Moderate scale, sharply defined small plaque psoriasis.  No joint inflammation, no fingernail changes  Objective  Right Outer Foot: Slightly marginated papulosquamous patch which would fit either tinea or psoriasis.  KOH negative.  Fungal culture sent.    A full examination was performed including scalp, head, eyes, ears, nose, lips, neck, chest, axillae, abdomen, back, buttocks, bilateral upper extremities, bilateral lower extremities, hands, feet, fingers, toes, fingernails, and toenails. All findings within normal limits unless otherwise noted below.   Assessment & Plan    Skin exam for malignant neoplasm Waist Up  Annual skin examination plus self examine twice annually.  Psoriasis (5) Left Elbow - Posterior; Right Elbow - Posterior; Left Lower Leg - Anterior; Scalp; Dorsal Penile Shaft  I again reviewed essentially all treatment options including newer systemic agents.  For now [name] would prefer to continue therapy with topicals  tacrolimus (PROTOPIC) 0.1 % ointment - Dorsal Penile Shaft, Left Lower Leg - Anterior  Calcipotriene-Betameth Diprop (ENSTILAR) 0.005-0.064 % FOAM - Left Elbow - Posterior, Right Elbow - Posterior  calcipotriene-betamethasone (TACLONEX SCALP) external suspension -  Scalp  Tinea pedis of right foot Right Outer Foot  Will defer adding an antifungal until culture is returned.  Other Related Procedures POCT Skin KOH Culture, fungus without smear      I, Bryce Monarch, MD, have reviewed all documentation for this visit.  The documentation on 06/13/20 for the exam, diagnosis, procedures, and orders are all accurate and complete.

## 2020-06-30 LAB — CULTURE, FUNGUS WITHOUT SMEAR
CULTURE:: NO GROWTH
MICRO NUMBER:: 11733029
SPECIMEN QUALITY:: ADEQUATE

## 2020-08-08 ENCOUNTER — Other Ambulatory Visit: Payer: Self-pay

## 2020-08-08 ENCOUNTER — Ambulatory Visit: Payer: BLUE CROSS/BLUE SHIELD | Admitting: Dermatology

## 2020-08-08 DIAGNOSIS — L409 Psoriasis, unspecified: Secondary | ICD-10-CM

## 2020-08-08 DIAGNOSIS — D485 Neoplasm of uncertain behavior of skin: Secondary | ICD-10-CM

## 2020-08-08 DIAGNOSIS — L570 Actinic keratosis: Secondary | ICD-10-CM | POA: Diagnosis not present

## 2020-08-08 NOTE — Patient Instructions (Signed)

## 2020-08-12 ENCOUNTER — Encounter: Payer: Self-pay | Admitting: Dermatology

## 2020-08-12 NOTE — Progress Notes (Signed)
   Follow-Up Visit   Subjective  Bryce Medina is a 55 y.o. male who presents for the following: Follow-up (Patient follow up on psoriasis. Used the tacrolimus. It helped some but patients left shin still had some breakout. But patient got out in the sun a bit and that really helped it.  ).  Psoriasis plus scaly spot on foot Location:  Duration:  Quality:  Associated Signs/Symptoms: Modifying Factors:  Severity:  Timing: Context:   Objective  Well appearing patient in no apparent distress; mood and affect are within normal limits. Scalp 3 mm gritty pink crust  Right Foot - Anterior Psoriasiform 4 cm lack with thin scale and some areas with sharp border.  Psoriasis versus porokeratosis versus CIS.     Left Lower Leg - Anterior Few plaques, no objective synovitis.    A full examination was performed including scalp, head, eyes, ears, nose, lips, neck, chest, axillae, abdomen, back, buttocks, bilateral upper extremities, bilateral lower extremities, hands, feet, fingers, toes, fingernails, and toenails. All findings within normal limits unless otherwise noted below.  Areas beneath undergarments not fully examined.   Assessment & Plan    AK (actinic keratosis) Scalp  Return if worsens, consider [place] in the winter.  Neoplasm of uncertain behavior of skin Right Foot - Anterior  Skin / nail biopsy Type of biopsy: tangential   Informed consent: discussed and consent obtained   Timeout: patient name, date of birth, surgical site, and procedure verified   Anesthesia: the lesion was anesthetized in a standard fashion   Anesthetic:  1% lidocaine w/ epinephrine 1-100,000 local infiltration Instrument used: flexible razor blade   Hemostasis achieved with: ferric subsulfate   Outcome: patient tolerated procedure well   Post-procedure details: wound care instructions given    Specimen 1 - Surgical pathology Differential Diagnosis: psoriasis/ SCC  Check Margins:  No  Psoriasis Left Lower Leg - Anterior  Reviewed essentially all treatment options including the importance of [place] continuing to maintain his fitness and healthy lifestyle.  Discussed the risk-benefit ratio of ultraviolet therapy.  We will okay refills of his topicals via MyChart or phone.  Related Medications tacrolimus (PROTOPIC) 0.1 % ointment APPLY TO AFFECTED AREA DAILY AFTER BATHING  Calcipotriene-Betameth Diprop (ENSTILAR) 0.005-0.064 % FOAM Apply 1 application topically daily.  calcipotriene-betamethasone (TACLONEX SCALP) external suspension Apply 1 application topically daily.      I, [name], MD, have reviewed all documentation for this visit.  The documentation on 08/12/20 for the exam, diagnosis, procedures, and orders are all accurate and complete.

## 2020-08-16 ENCOUNTER — Telehealth: Payer: Self-pay

## 2020-08-16 NOTE — Telephone Encounter (Signed)
Left message to call office

## 2020-08-16 NOTE — Telephone Encounter (Signed)
-----   Message from [name], MD sent at 08/16/2020  5:53 AM EDT ----- Please let Bryce Medina know that the biopsy supports this being part of his psoriasis rather than fungus or skin cancer.  Once the biopsy heals he may try his Enstilar daily after bathing for 4 weeks on this scaly area. ----- Message ----- From: Interface, Lab In [facility] [place] Sent: 08/15/2020   7:17 PM EDT To: [name], MD

## 2020-08-25 ENCOUNTER — Telehealth: Payer: Self-pay

## 2020-08-25 NOTE — Telephone Encounter (Signed)
3 phone call attempts and today mychart message sent

## 2020-08-25 NOTE — Telephone Encounter (Signed)
-----   Message from [name], MD sent at 08/16/2020  5:53 AM EDT ----- Please let Legrand Como know that the biopsy supports this being part of his psoriasis rather than fungus or skin cancer.  Once the biopsy heals he may try his Enstilar daily after bathing for 4 weeks on this scaly area. ----- Message ----- From: Interface, Lab In [facility] [place] [facility] Sent: 08/15/2020   7:17 PM EDT To: [name], MD

## 2021-06-03 ENCOUNTER — Other Ambulatory Visit: Payer: Self-pay | Admitting: Dermatology

## 2021-06-03 DIAGNOSIS — L409 Psoriasis, unspecified: Secondary | ICD-10-CM

## 2021-06-04 ENCOUNTER — Other Ambulatory Visit: Payer: Self-pay | Admitting: Dermatology

## 2021-06-04 DIAGNOSIS — L409 Psoriasis, unspecified: Secondary | ICD-10-CM

## 2021-06-05 NOTE — Telephone Encounter (Signed)
Keep June follow up refill approved. ?

## 2021-08-08 ENCOUNTER — Ambulatory Visit: Payer: BLUE CROSS/BLUE SHIELD | Admitting: Dermatology

## 2022-07-03 ENCOUNTER — Encounter: Payer: Self-pay | Admitting: Neurosurgery

## 2022-07-03 ENCOUNTER — Ambulatory Visit
Admission: RE | Admit: 2022-07-03 | Discharge: 2022-07-03 | Disposition: A | Discharge status: Home / Self Care | Payer: BC Managed Care – PPO | Attending: Neurosurgery | Admitting: Neurosurgery

## 2022-07-03 ENCOUNTER — Ambulatory Visit: Payer: BC Managed Care – PPO | Admitting: Neurosurgery

## 2022-07-03 ENCOUNTER — Ambulatory Visit
Admission: RE | Admit: 2022-07-03 | Discharge: 2022-07-03 | Disposition: A | Discharge status: Home / Self Care | Payer: BC Managed Care – PPO | Source: Ambulatory Visit | Attending: Neurosurgery | Admitting: Neurosurgery

## 2022-07-03 VITALS — BP 142/84 | HR 90 | Ht 71.0 in | Wt 188.6 lb

## 2022-07-03 DIAGNOSIS — M47819 Spondylosis without myelopathy or radiculopathy, site unspecified: Secondary | ICD-10-CM

## 2022-07-03 DIAGNOSIS — M5416 Radiculopathy, lumbar region: Secondary | ICD-10-CM | POA: Insufficient documentation

## 2022-07-03 DIAGNOSIS — M4726 Other spondylosis with radiculopathy, lumbar region: Secondary | ICD-10-CM

## 2022-07-03 NOTE — Patient Instructions (Addendum)
Referral placed to [facility] with [facility].  Please call [phone] to schedule.  If this location does not work for you please let me know and I am happy to send the referral elsewhere.  Order placed for MRI of the lumbar spine without contrast.  The hospital should call you to schedule this.  Please go across the street today after your visit to obtain flexion-extension lumbar x-rays.  Once I have your MRI and x-rays, I will set up a telephone visit to call you and review the results and discuss further plan of care.
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
## 2022-07-11 ENCOUNTER — Ambulatory Visit
Admission: RE | Admit: 2022-07-11 | Discharge: 2022-07-11 | Disposition: A | Discharge status: Home / Self Care | Payer: BC Managed Care – PPO | Source: Ambulatory Visit | Attending: Neurosurgery | Admitting: Neurosurgery

## 2022-07-11 DIAGNOSIS — M47819 Spondylosis without myelopathy or radiculopathy, site unspecified: Secondary | ICD-10-CM

## 2022-07-11 DIAGNOSIS — M5416 Radiculopathy, lumbar region: Secondary | ICD-10-CM

## 2022-07-26 ENCOUNTER — Ambulatory Visit (INDEPENDENT_AMBULATORY_CARE_PROVIDER_SITE_OTHER): Payer: BC Managed Care – PPO | Admitting: Neurosurgery

## 2022-07-26 DIAGNOSIS — M47816 Spondylosis without myelopathy or radiculopathy, lumbar region: Secondary | ICD-10-CM

## 2022-07-26 DIAGNOSIS — M48062 Spinal stenosis, lumbar region with neurogenic claudication: Secondary | ICD-10-CM

## 2022-07-26 DIAGNOSIS — M4316 Spondylolisthesis, lumbar region: Secondary | ICD-10-CM

## 2022-07-26 NOTE — Progress Notes (Signed)
Neurosurgery Telephone (Audio-Only) Note  Requesting Provider     Kandyce Rud, MD [phone] [address] [facility] [place],  [place] [address] T: [phone] F: [phone]  Primary Care Provider Kandyce Rud, MD [phone] [address] [facility] [place] [address] T: [phone] F: [phone]  Telehealth visit was conducted with Bryce Medina, a 57 y.o. male via telephone.  History of Present Illness: Mr. Vanderhoef is a very pleasant 57 year old presenting today via telephone visit to review the results of his MRI and lumbar flexion-extension x-rays.  He continues to have significant back pain when getting up from a seated position as well as pain that radiates into his right lateral hip which is worse than it was at his last visit.  He continues to have buttock pain as well.  He is currently scheduled to start physical therapy in a couple of weeks.  LOV 07/03/2022 Mr. Tadeo Harmelink is a 57 y.o with a history of GERD, and hypertension here today for further evaluation of acute worsening of lumbosacral complaints.  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 Myer Haff.  Surgery was successful and he has done well until about a year ago when he started having increasing low back pain particularly with activity.  His symptoms have progressed and have been particularly worse over the last 3 to 4 months.  He states he has trouble walking and feels as though his legs are going to give out on him particularly with the first couple of steps.  He also notes a nerve like pain in his bilateral buttocks particularly with getting up and walking.  He has a take a really difficult time with activities such as gardening, weed eating, vacuuming, and sweeping.  He denies any pain that radiates down the length of his legs and states that this is different than his preoperative pain  in 2018.  He has tried some home exercises as well as over-the-counter medications with minimal improvement.   Conservative measures:  Physical therapy: has not participated  Multimodal medical therapy including regular antiinflammatories: aleve, ibuprofen  Injections: has not received epidural steroid injections   Past Surgery: 12/19/16 right synovial cyst resection   TENN[place] SLOMKA has no symptoms of cervical myelopathy.   The symptoms are causing a significant impact on the patient's life.   General Review of Systems:  A ROS was performed including pertinent positive and negatives as documented.  All other systems are negative.   Prior to Admission medications   Medication Sig Start Date End Date Taking? Authorizing Provider  calcipotriene-betamethasone [facility] SCALP) external suspension Apply 1 application topically daily. 05/31/20   Janalyn Harder, MD  ENSTILAR 0.005-0.064 % FOAM APPLY TOPICALLY TO THE AFFECTED AREA DAILY 06/05/21   Janalyn Harder, MD  EPINEPHrine 0.3 mg/0.3 mL IJ SOAJ injection as needed 08/24/17   [provider]  levocetirizine (XYZAL) 5 MG tablet Take 5 mg by mouth daily as needed for allergies.    [provider]  omeprazole (PRILOSEC) 40 MG capsule Take 40 mg by mouth 2 (two) times daily.     [provider]  tadalafil (CIALIS) 5 MG tablet TAKE 1 TABLET BY MOUTH EVERY DAY AS NEEDED 04/02/19   Marvel Plan, Carollee Herter A, PA-C  telmisartan (MICARDIS) 40 MG tablet Take 40 mg by mouth daily.    [provider]    DATA REVIEWED    Imaging  Studies  07/03/22 lumbar flex/ex  IMPRESSION: 1. Grade 1 anterolisthesis of L3 on L4 that slightly decreases with extension. 2. Minimal retrolisthesis of L4 on L5 that does not significantly change with motion.     Electronically Signed   By: Richarda Overlie M.D.   On: 07/07/2022 07:38  07/11/22 MRI lumbar spine IMPRESSION: Multilevel degenerative changes of the lumbar spine, progressed since  October 2013.   L2-L3: Moderate spinal canal stenosis. Minimal right neural foraminal narrowing.   L3-L4: Moderate-severe spinal canal stenosis. Moderate-severe right and mild-to-moderate left neural foraminal stenosis.   L4-L5: Bilateral subarticular stenosis encroaching the descending L5 nerve roots. Moderate-severe right and minimal left neural foraminal stenosis.   L5-S1: Left greater than right subarticular stenosis with potential impingement of the descending left S1 nerve root. Mild-to-moderate bilateral neural foraminal narrowing.   Mild marrow edema signal within the S3, S4, and partially visualized S5 segments centrally, specific but progressive since October 2013. This is favored to reflect a chronic stress reaction.     Electronically Signed   By: Caprice Renshaw M.D.   On: 07/17/2022 13:23  IMPRESSION  Mr. Exposito is a 57 y.o. male who I performed a telephone encounter today for evaluation and management of: Lumbar stenosis, spondylolisthesis, and neurogenic claudication  PLAN  Mr. Gillyard is a very pleasant 57 year old male presenting with symptoms concerning for facet arthropathy and lumbar radiculopathy/neurogenic claudication.  His MRI does show significant lumbar stenosis particularly at L3-4 with corresponding facet arthropathy.  His x-rays do show a grade 1 spondylolisthesis at this area.  I do believe that this is what is responsible for his symptoms.  We discussed the importance of maximizing conservative treatment and he is to undergo physical therapy.  He will let us know once he has completed 6 weeks or been discharged.  We discussed potentially considering facet versus epidural injections however he would like to initiate physical therapy first.  If he changes his mind or his symptoms worsen he will let us know and we will order injections.  I have scheduled him for a telephone visit in 8 weeks to review his progress and discuss potential next steps.  He was  encouraged to call us in the interim with any questions or concerns.  He expressed understanding and was in agreement with this plan.  No orders of the defined types were placed in this encounter.  DISPOSITION  Follow up: 8 weeks via telephone visit  Susanne Borders, PA   TELEPHONE DOCUMENTATION   This visit was performed via telephone.  Patient location: home Provider location: office  I spent a total of 20 minutes non-face-to-face activities for this visit on the date of this encounter including review of current clinical condition and response to treatment including review of imaging, discussion of symptoms, discussion of plan of care, and documentation.  The patient is aware of and accepts the limits of this telehealth visit.

## 2022-08-09 ENCOUNTER — Telehealth: Payer: BC Managed Care – PPO | Admitting: Neurosurgery

## 2022-09-20 ENCOUNTER — Ambulatory Visit (INDEPENDENT_AMBULATORY_CARE_PROVIDER_SITE_OTHER): Payer: BC Managed Care – PPO | Admitting: Neurosurgery

## 2022-09-20 DIAGNOSIS — M4726 Other spondylosis with radiculopathy, lumbar region: Secondary | ICD-10-CM

## 2022-09-20 DIAGNOSIS — M5416 Radiculopathy, lumbar region: Secondary | ICD-10-CM

## 2022-09-20 DIAGNOSIS — M47816 Spondylosis without myelopathy or radiculopathy, lumbar region: Secondary | ICD-10-CM

## 2022-09-20 NOTE — Progress Notes (Signed)
Neurosurgery Telephone (Audio-Only) Note  Requesting Provider     Kandyce Rud, MD [phone] [address] [facility] [place],  [place] [address] T: [phone] F: [phone]  Primary Care Provider Kandyce Rud, MD [phone] [address] [facility] [place] [address] T: [phone] F: [phone]  Telehealth visit was conducted with Bryce Medina, a 57 y.o. male via telephone.   History of Present Illness: Bryce Medina is a pleasant 57 year old presenting today via telephone visit to discuss his response to physical therapy.  He has continued with physical therapy but unfortunately has not seen any significant improvement in his back and right leg pain.  07/26/22 Bryce Medina is a very pleasant 57 year old presenting today via telephone visit to review the results of his MRI and lumbar flexion-extension x-rays.  He continues to have significant back pain when getting up from a seated position as well as pain that radiates into his right lateral hip which is worse than it was at his last visit.  He continues to have buttock pain as well.  He is currently scheduled to start physical therapy in a couple of weeks.   07/03/2022 Bryce Medina is a 57 y.o with a history of GERD, and hypertension here today for further evaluation of acute worsening of lumbosacral complaints.  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 Myer Haff.  Surgery was successful and he has done well until about a year ago when he started having increasing low back pain particularly with activity.  His symptoms have progressed and have been particularly worse over the last 3 to 4 months.  He states he has trouble walking and feels as though his legs are going to give out on him particularly with the first couple of steps.  He also notes a nerve like pain in his bilateral buttocks particularly with  getting up and walking.  He has a take a really difficult time with activities such as gardening, weed eating, vacuuming, and sweeping.  He denies any pain that radiates down the length of his legs and states that this is different than his preoperative pain in 2018.  He has tried some home exercises as well as over-the-counter medications with minimal improvement.   Conservative measures:  Physical therapy: has not participated  Multimodal medical therapy including regular antiinflammatories: aleve, ibuprofen  Injections: has not received epidural steroid injections   Past Surgery: 12/19/16 right synovial cyst resection   Bryce Medina has no symptoms of cervical myelopathy.   The symptoms are causing a significant impact on the patient's life.   General Review of Systems:  A ROS was performed including pertinent positive and negatives as documented.  All other systems are negative.   Prior to Admission medications   Medication Sig Start Date End Date Taking? Authorizing Provider  calcipotriene-betamethasone [facility] SCALP) external suspension Apply 1 application topically daily. 05/31/20   Janalyn Harder, MD  ENSTILAR 0.005-0.064 % FOAM APPLY TOPICALLY TO THE AFFECTED AREA DAILY 06/05/21   Janalyn Harder, MD  EPINEPHrine 0.3 mg/0.3 mL IJ SOAJ injection as needed 08/24/17   [provider]  levocetirizine (XYZAL) 5 MG tablet Take 5 mg by mouth daily as needed for allergies.    [provider]  omeprazole (PRILOSEC) 40 MG capsule Take 40 mg by mouth 2 (two) times daily.     [provider]  tadalafil (CIALIS) 5 MG tablet  TAKE 1 TABLET BY MOUTH EVERY DAY AS NEEDED 04/02/19   Marvel Plan, Carollee Herter A, PA-C  telmisartan (MICARDIS) 40 MG tablet Take 40 mg by mouth daily.    [provider]    DATA REVIEWED    Imaging Studies  07/11/22 MRI L spine FINDINGS: Segmentation:  Standard.   Alignment: Trace degenerative anterolisthesis at L3-L4 and retrolisthesis  at L4-L5 and L5-S1.   Vertebrae: No evidence of acute fracture, discitis, or aggressive osseous lesion. There is mild marrow edema with preserved T1 signal within the S3, S4, and partially visualized S5 segments centrally, nonspecific. This was present in October 2013 to a lesser degree within S3. No CT correlate in July 2020.   Conus medullaris and cauda equina: Conus extends to the L1 level. Conus and cauda equina appear normal.   Paraspinal and other soft tissues: Minimal right lower paraspinal muscle edema, nonspecific.   Disc levels:   T12-L1: No significant spinal canal or neural foraminal narrowing.   L1-L2: No significant spinal canal or neural foraminal narrowing.   L2-L3: Asymmetric right disc bulging, with ligament flavum hypertrophy and mild facet arthropathy results in moderate spinal canal stenosis and minimal right neural foraminal narrowing.   L3-L4: Trace degenerative anterolisthesis with broad disc bulging, ligament flavum hypertrophy and mild facet arthropathy with trace effusions moderate severe spinal canal stenosis with moderate-severe right and mild-to-moderate left neural foraminal stenosis.   L4-L5: Trace degenerative retrolisthesis with broad, asymmetric right disc bulging, with ligament flavum hypertrophy and mild facet arthropathy. There is bilateral subarticular stenosis encroaching the descending L5 nerve roots. There is moderate-severe right and minimal left neural foraminal stenosis.   L5-S1: Trace degenerative retrolisthesis with broad disc bulging, ligamentum flavum thickening and left greater than right facet arthropathy. There is left greater than right subarticular stenosis with potential impingement of the descending left S1 nerve root. Mild-to-moderate bilateral neural foraminal narrowing.   IMPRESSION: Multilevel degenerative changes of the lumbar spine, progressed since October 2013.   L2-L3: Moderate spinal canal stenosis. Minimal  right neural foraminal narrowing.   L3-L4: Moderate-severe spinal canal stenosis. Moderate-severe right and mild-to-moderate left neural foraminal stenosis.   L4-L5: Bilateral subarticular stenosis encroaching the descending L5 nerve roots. Moderate-severe right and minimal left neural foraminal stenosis.   L5-S1: Left greater than right subarticular stenosis with potential impingement of the descending left S1 nerve root. Mild-to-moderate bilateral neural foraminal narrowing.   Mild marrow edema signal within the S3, S4, and partially visualized S5 segments centrally, specific but progressive since October 2013. This is favored to reflect a chronic stress reaction.     Electronically Signed   By: Caprice Renshaw M.D.   On: 07/17/2022 13:23   IMPRESSION  Mr. Corniel is a 57 y.o. male who I performed a telephone encounter today for evaluation and management of: Lumbar stenosis, neuroforaminal stenosis, and radiculopathy  PLAN  Mr. Pinheiro is a very pleasant 57 year old with longstanding symptoms concerning for lumbar radiculopathy and neurogenic claudication.  Unfortunately he has failed to improve with physical therapy.  He believes he has done for 5 weeks of physical therapy.  I encouraged him to complete the full 6 weeks.  He would like to move forward with additional treatment.  I have placed a referral to Dr. Cherylann Ratel for consideration of lumbar epidural injections.  Mr. Worrell will let me know when this is scheduled and I will see him back in clinic a couple weeks following his injection to evaluate his progress.  He was encouraged to call the  office in the interim should he have any questions or concerns.  He expressed understanding and was in agreement with this plan.  No orders of the defined types were placed in this encounter.  DISPOSITION  Follow up: In person appointment in  after [facility]  Susanne Borders, PA   TELEPHONE DOCUMENTATION   This visit was performed via  telephone.  Patient location: home Provider location: office  I spent a total of 5 minutes non-face-to-face activities for this visit on the date of this encounter including review of current clinical condition and response to treatment.  The patient is aware of and accepts the limits of this telehealth visit.

## 2022-10-25 ENCOUNTER — Encounter: Payer: Self-pay | Admitting: Student in an Organized Health Care Education/Training Program

## 2022-10-25 ENCOUNTER — Ambulatory Visit
Payer: BC Managed Care – PPO | Attending: Student in an Organized Health Care Education/Training Program | Admitting: Student in an Organized Health Care Education/Training Program

## 2022-10-25 VITALS — BP 140/98 | HR 94 | Temp 98.1°F | Resp 16 | Ht 71.0 in | Wt 186.0 lb

## 2022-10-25 DIAGNOSIS — M5416 Radiculopathy, lumbar region: Secondary | ICD-10-CM | POA: Diagnosis not present

## 2022-10-25 DIAGNOSIS — M961 Postlaminectomy syndrome, not elsewhere classified: Secondary | ICD-10-CM | POA: Diagnosis not present

## 2022-10-25 DIAGNOSIS — M48061 Spinal stenosis, lumbar region without neurogenic claudication: Secondary | ICD-10-CM | POA: Diagnosis not present

## 2022-10-25 DIAGNOSIS — G8929 Other chronic pain: Secondary | ICD-10-CM | POA: Diagnosis not present

## 2022-10-25 NOTE — Progress Notes (Signed)
Patient: Bryce Medina  Service Category: E/M  Provider: Edward Jolly, MD  DOB: [birth year]  DOS: 10/25/2022  Referring Provider: Susanne Borders, PA  MRN: [id number]  Setting: Ambulatory outpatient  PCP: Kandyce Rud, MD  Type: New Patient  Specialty: Interventional Pain Management    Location: Office  Delivery: Face-to-face     Primary Reason(s) for Visit: Encounter for initial evaluation of one or more chronic problems (new to examiner) potentially causing chronic pain, and posing a threat to normal musculoskeletal function. (Level of risk: High) CC: Back Pain  HPI  Bryce Medina is a 57 y.o. year old, male patient, who comes for the first time to our practice referred by Susanne Borders, PA for our initial evaluation of his chronic pain. He has Chronic radicular lumbar pain; Lumbar foraminal stenosis; Lumbar spondylosis; Synovial cyst of lumbar spine; GERD without esophagitis; Hemorrhoids; Laryngopharyngeal reflux (LPR); Seasonal allergies; and Lumbar post-laminectomy syndrome on their problem list. Today he comes in for evaluation of his Back Pain  Pain Assessment: Location: Lower Back Radiating: to buttocks bilat Onset:   Duration: Chronic pain Quality: Aching, Constant Severity: 3 /10 (subjective, self-reported pain score)  Effect on ADL: worse with activitiy, limits daily activities Timing:   Modifying factors: rest, ice BP: (!) 140/98  HR: 94  Onset and Duration: Gradual and Present longer than 3 months Cause of pain: Unknown Severity: Getting worse, NAS-11 at its worse: 8/10, NAS-11 at its best: 1/10, NAS-11 now: 3/10, and NAS-11 on the average: 5/10 Timing: During activity or exercise and After activity or exercise Aggravating Factors: Bending, Lifiting, Walking, and Working Alleviating Factors: Cold packs, Resting, Sitting, and Sleeping Associated Problems: Night-time cramps, Erectile dysfunction, Fatigue, Inability to concentrate, Swelling, and Weakness Quality of  Pain: Annoying, Deep, Sharp, Shooting, Tiring, Toothache-like, and Uncomfortable Previous Examinations or Tests: Endoscopy and MRI scan Previous Treatments: Physical Therapy  Bryce Medina is being evaluated for possible interventional pain management therapies for the treatment of his chronic pain.   Patient today presents with increased low back and right leg pain.  He has been evaluated by neurosurgery in the past.  Of note, he has lumbar radicular pain and nerve root compression in 2018 related to a right L3-L4 synovial cyst that was resected by Dr. Marcell Barlow.  This helped with his symptoms at that time however about a year ago he started to notice increased low back pain with radiation into his right lateral leg.  He states that his pain is worse when he is going from a seated to standing position.  He also has pain in his right buttock.  He has been working with physical therapy however with limited response.  He states that at times he feels like his pain is going to result in his legs giving out.  He has difficulty with ADLs.  His MRI results are below, he is being referred from neurosurgery for a fast-track right L3-L4 and L4-L5 epidural steroid injection.  Meds   Current Outpatient Medications:    calcipotriene-betamethasone (TACLONEX SCALP) external suspension, Apply 1 application topically daily., Disp: 120 g, Rfl: 11   ENSTILAR 0.005-0.064 % FOAM, APPLY TOPICALLY TO THE AFFECTED AREA DAILY, Disp: 60 g, Rfl: 11   EPINEPHrine 0.3 mg/0.3 mL IJ SOAJ injection, as needed, Disp: , Rfl:    levocetirizine (XYZAL) 5 MG tablet, Take 5 mg by mouth daily as needed for allergies., Disp: , Rfl:    omeprazole (PRILOSEC) 40 MG capsule, Take 40 mg by mouth 2 (two)  times daily. , Disp: , Rfl:    tadalafil (CIALIS) 5 MG tablet, TAKE 1 TABLET BY MOUTH EVERY DAY AS NEEDED, Disp: 30 tablet, Rfl: 3   telmisartan (MICARDIS) 40 MG tablet, Take 40 mg by mouth daily., Disp: , Rfl:   Imaging Review  MR LUMBAR  SPINE WO CONTRAST  Narrative CLINICAL DATA:  Lumbar radiculopathy, prior surgery, new symptoms  EXAM: MRI LUMBAR SPINE WITHOUT CONTRAST  TECHNIQUE: Multiplanar, multisequence MR imaging of the lumbar spine was performed. No intravenous contrast was administered.  COMPARISON:  Lumbar spine radiograph 07/03/2022, CT 09/02/2018, MRI 12/26/2011  FINDINGS: Segmentation:  Standard.  Alignment: Trace degenerative anterolisthesis at L3-L4 and retrolisthesis at L4-L5 and L5-S1.  Vertebrae: No evidence of acute fracture, discitis, or aggressive osseous lesion. There is mild marrow edema with preserved T1 signal within the S3, S4, and partially visualized S5 segments centrally, nonspecific. This was present in October 2013 to a lesser degree within S3. No CT correlate in July 2020.  Conus medullaris and cauda equina: Conus extends to the L1 level. Conus and cauda equina appear normal.  Paraspinal and other soft tissues: Minimal right lower paraspinal muscle edema, nonspecific.  Disc levels:  T12-L1: No significant spinal canal or neural foraminal narrowing.  L1-L2: No significant spinal canal or neural foraminal narrowing.  L2-L3: Asymmetric right disc bulging, with ligament flavum hypertrophy and mild facet arthropathy results in moderate spinal canal stenosis and minimal right neural foraminal narrowing.  L3-L4: Trace degenerative anterolisthesis with broad disc bulging, ligament flavum hypertrophy and mild facet arthropathy with trace effusions moderate severe spinal canal stenosis with moderate-severe right and mild-to-moderate left neural foraminal stenosis.  L4-L5: Trace degenerative retrolisthesis with broad, asymmetric right disc bulging, with ligament flavum hypertrophy and mild facet arthropathy. There is bilateral subarticular stenosis encroaching the descending L5 nerve roots. There is moderate-severe right and minimal left neural foraminal stenosis.  L5-S1:  Trace degenerative retrolisthesis with broad disc bulging, ligamentum flavum thickening and left greater than right facet arthropathy. There is left greater than right subarticular stenosis with potential impingement of the descending left S1 nerve root. Mild-to-moderate bilateral neural foraminal narrowing.  IMPRESSION: Multilevel degenerative changes of the lumbar spine, progressed since October 2013.  L2-L3: Moderate spinal canal stenosis. Minimal right neural foraminal narrowing.  L3-L4: Moderate-severe spinal canal stenosis. Moderate-severe right and mild-to-moderate left neural foraminal stenosis.  L4-L5: Bilateral subarticular stenosis encroaching the descending L5 nerve roots. Moderate-severe right and minimal left neural foraminal stenosis.  L5-S1: Left greater than right subarticular stenosis with potential impingement of the descending left S1 nerve root. Mild-to-moderate bilateral neural foraminal narrowing.  Mild marrow edema signal within the S3, S4, and partially visualized S5 segments centrally, specific but progressive since October 2013. This is favored to reflect a chronic stress reaction.   Electronically Signed By: Caprice Renshaw M.D. On: 07/17/2022 13:23   DG Lumbar Spine 2-3 Views  Narrative CLINICAL DATA:  Back pain.  EXAM: LUMBAR SPINE - 2-3 VIEW  COMPARISON:  None Available.  FINDINGS: Lateral views of the lumbar spine were obtained. Grade 1 anterolisthesis of L3 on L4. The anterolisthesis slightly decreases with extension but does not significantly change with flexion. Minimal retrolisthesis of L4 on L5 does not significantly change with motion. Posterior disc space narrowing at L5-S1. Vertebral body heights are maintained. Evidence for degenerative facet arthropathy in the lumbar spine.  IMPRESSION: 1. Grade 1 anterolisthesis of L3 on L4 that slightly decreases with extension. 2. Minimal retrolisthesis of L4 on L5 that does not  significantly change with motion.   Electronically Signed By: Richarda Overlie M.D. On: 07/07/2022 07:38  Complexity Note: Imaging results reviewed.                         ROS  Cardiovascular: High blood pressure Pulmonary or Respiratory: No reported pulmonary signs or symptoms such as wheezing and difficulty taking a deep full breath (Asthma), difficulty blowing air out (Emphysema), coughing up mucus (Bronchitis), persistent dry cough, or temporary stoppage of breathing during sleep Neurological: No reported neurological signs or symptoms such as seizures, abnormal skin sensations, urinary and/or fecal incontinence, being born with an abnormal open spine and/or a tethered spinal cord Psychological-Psychiatric: Depressed Gastrointestinal: Reflux or heatburn Genitourinary: Passing kidney stones Hematological: No reported hematological signs or symptoms such as prolonged bleeding, low or poor functioning platelets, bruising or bleeding easily, hereditary bleeding problems, low energy levels due to low hemoglobin or being anemic Endocrine: No reported endocrine signs or symptoms such as high or low blood sugar, rapid heart rate due to high thyroid levels, obesity or weight gain due to slow thyroid or thyroid disease Rheumatologic: No reported rheumatological signs and symptoms such as fatigue, joint pain, tenderness, swelling, redness, heat, stiffness, decreased range of motion, with or without associated rash Musculoskeletal: Negative for myasthenia gravis, muscular dystrophy, multiple sclerosis or malignant hyperthermia Work History: Working part time  Allergies  Mr. Ice is allergic to bee venom.  Laboratory Chemistry Profile   Renal Lab Results  Component Value Date   BUN 20 09/02/2018   CREATININE 1.86 (H) 09/02/2018   GFRAA 48 (L) 09/02/2018   GFRNONAA 41 (L) 09/02/2018   PROTEINUR 30 (A) 09/02/2018     Electrolytes Lab Results  Component Value Date   NA 141 11/12/2018   K  4.3 09/02/2018   CL 103 09/02/2018   CALCIUM 9.5 09/02/2018     Hepatic Lab Results  Component Value Date   AST 23 09/02/2018   ALT 31 09/02/2018   ALBUMIN 4.1 09/02/2018   ALKPHOS 87 09/02/2018   LIPASE 27 09/02/2018     ID Lab Results  Component Value Date   SARSCOV2NAA NOT DETECTED 09/02/2018   STAPHAUREUS POSITIVE (A) 12/14/2016   MRSAPCR NEGATIVE 12/14/2016     Bone No results found for: "VD25OH", "VD125OH2TOT", "ZS0109NA3", "FT7322GU5", "25OHVITD1", "25OHVITD2", "25OHVITD3", "TESTOFREE", "TESTOSTERONE"   Endocrine Lab Results  Component Value Date   GLUCOSE 115 (H) 09/02/2018   GLUCOSEU NEGATIVE 09/02/2018     Neuropathy No results found for: "VITAMINB12", "FOLATE", "HGBA1C", "HIV"   CNS No results found for: "COLORCSF", "APPEARCSF", "RBCCOUNTCSF", "WBCCSF", "POLYSCSF", "LYMPHSCSF", "EOSCSF", "PROTEINCSF", "GLUCCSF", "JCVIRUS", "CSFOLI", "IGGCSF", "LABACHR", "ACETBL"   Inflammation (CRP: Acute  ESR: Chronic) No results found for: "CRP", "ESRSEDRATE", "LATICACIDVEN"   Rheumatology No results found for: "RF", "ANA", "LABURIC", "URICUR", "LYMEIGGIGMAB", "LYMEABIGMQN", "HLAB27"   Coagulation Lab Results  Component Value Date   INR 0.93 12/14/2016   LABPROT 12.4 12/14/2016   APTT 30 12/14/2016   PLT 324 09/02/2018     Cardiovascular Lab Results  Component Value Date   HGB 13.3 09/02/2018   HCT 40.2 09/02/2018     Screening Lab Results  Component Value Date   SARSCOV2NAA NOT DETECTED 09/02/2018   COVIDSOURCE NASOPHARYNGEAL 09/02/2018   STAPHAUREUS POSITIVE (A) 12/14/2016   MRSAPCR NEGATIVE 12/14/2016     Cancer No results found for: "CEA", "CA125", "LABCA2"   Allergens No results found for: "ALMOND", "APPLE", "ASPARAGUS", "AVOCADO", "BANANA", "BARLEY", "BASIL", "BAYLEAF", "GREENBEAN", "LIMABEAN", "  WHITEBEAN", "BEEFIGE", "REDBEET", "BLUEBERRY", "BROCCOLI", "CABBAGE", "MELON", "CARROT", "CASEIN", "CASHEWNUT", "CAULIFLOWER", "CELERY"     Note: Lab  results reviewed.  PFSH  Drug: Mr. Ela  reports no history of drug use. Alcohol:  reports no history of alcohol use. Tobacco:  reports that he has never smoked. He has never used smokeless tobacco. Medical:  has a past medical history of Arthritis, Broken ribs, GERD (gastroesophageal reflux disease), History of kidney stones, Pneumothorax on right (11/2013), PONV (postoperative nausea and vomiting), and Psoriasis. Family: family history includes Diabetes in his maternal grandmother; Hypertension in his father.  Past Surgical History:  Procedure Laterality Date   APPENDECTOMY     CYSTOSCOPY WITH RETROGRADE PYELOGRAM, URETEROSCOPY AND STENT PLACEMENT N/A 07/04/2016   Procedure: CYSTOSCOPY WITH RETROGRADE PYELOGRAM, URETEROSCOPY AND STENT PLACEMENT;  Surgeon: Sebastian Ache, MD;  Location: [facility];  Service: Urology;  Laterality: N/A;   EXTRACORPOREAL SHOCK WAVE LITHOTRIPSY Left 09/04/2018   Procedure: EXTRACORPOREAL SHOCK WAVE LITHOTRIPSY (ESWL);  Surgeon: Sondra Come, MD;  Location: [facility];  Service: Urology;  Laterality: Left;   HERNIA REPAIR     left inguinla hernia repair    HOLMIUM LASER APPLICATION Right 07/04/2016   Procedure: HOLMIUM LASER APPLICATION;  Surgeon: Sebastian Ache, MD;  Location: [facility];  Service: Urology;  Laterality: Right;   left shoulder surgery  Left    shoulder separation surgery with tendon repair   LUMBAR LAMINECTOMY/DECOMPRESSION MICRODISCECTOMY N/A 12/19/2016   Procedure: LUMBAR LAMINECTOMY/DECOMPRESSION MICRODISCECTOMY 1 LEVEL-L3-4- SYNOVIAL CYST RESECTION;  Surgeon: Venetia Night, MD;  Location: [facility];  Service: Neurosurgery;  Laterality: N/A;   Active Ambulatory Problems    Diagnosis Date Noted   Chronic radicular lumbar pain 12/05/2016   Lumbar foraminal stenosis 12/05/2016   Lumbar spondylosis 12/05/2016   Synovial cyst of lumbar spine 12/05/2016   GERD without esophagitis 10/22/2017   Hemorrhoids 09/03/2018   Laryngopharyngeal reflux  (LPR) 06/26/2018   Seasonal allergies 10/22/2017   Lumbar post-laminectomy syndrome 10/25/2022   Resolved Ambulatory Problems    Diagnosis Date Noted   No Resolved Ambulatory Problems   Past Medical History:  Diagnosis Date   Arthritis    Broken ribs    GERD (gastroesophageal reflux disease)    History of kidney stones    Pneumothorax on right 11/2013   PONV (postoperative nausea and vomiting)    Psoriasis    Constitutional Exam  General appearance: Well nourished, well developed, and well hydrated. In no apparent acute distress Vitals:   10/25/22 1308  BP: (!) 140/98  Pulse: 94  Resp: 16  Temp: 98.1 F (36.7 C)  SpO2: 99%  Weight: 186 lb (84.4 kg)  Height: 5\' 11"  (1.803 m)   BMI Assessment: Estimated body mass index is 25.94 kg/m as calculated from the following:   Height as of this encounter: 5\' 11"  (1.803 m).   Weight as of this encounter: 186 lb (84.4 kg).  BMI interpretation table: BMI level Category Range association with higher incidence of chronic pain  <18 kg/m2 Underweight   18.5-24.9 kg/m2 Ideal body weight   25-29.9 kg/m2 Overweight Increased incidence by 20%  30-34.9 kg/m2 Obese (Class I) Increased incidence by 68%  35-39.9 kg/m2 Severe obesity (Class II) Increased incidence by 136%  >40 kg/m2 Extreme obesity (Class III) Increased incidence by 254%   Patient's current BMI Ideal Body weight  Body mass index is 25.94 kg/m. Ideal body weight: 75.3 kg (166 lb 0.1 oz) Adjusted ideal body weight: 78.9 kg (174 lb 0.1 oz)   BMI  Readings from Last 4 Encounters:  10/25/22 25.94 kg/m  07/03/22 26.30 kg/m  11/04/18 25.80 kg/m  10/01/18 26.08 kg/m   Wt Readings from Last 4 Encounters:  10/25/22 186 lb (84.4 kg)  07/03/22 188 lb 9.6 oz (85.5 kg)  11/04/18 185 lb (83.9 kg)  10/01/18 187 lb (84.8 kg)    Psych/Mental status: Alert, oriented x 3 (person, place, & time)       Eyes: PERLA Respiratory: No evidence of acute respiratory distress  Lumbar  Spine Area Exam  Skin & Axial Inspection: Well healed scar from previous spine surgery detected Alignment: Symmetrical Functional ROM: Pain restricted ROM affecting primarily the right Stability: No instability detected Muscle Tone/Strength: Functionally intact. No obvious neuro-muscular anomalies detected. Sensory (Neurological): Dermatomal pain pattern  Gait & Posture Assessment  Ambulation: Unassisted Gait: Relatively normal for age and body habitus Posture: WNL  Lower Extremity Exam    Side: Right lower extremity  Side: Left lower extremity  Stability: No instability observed          Stability: No instability observed          Skin & Extremity Inspection: Skin color, temperature, and hair growth are WNL. No peripheral edema or cyanosis. No masses, redness, swelling, asymmetry, or associated skin lesions. No contractures.  Skin & Extremity Inspection: Skin color, temperature, and hair growth are WNL. No peripheral edema or cyanosis. No masses, redness, swelling, asymmetry, or associated skin lesions. No contractures.  Functional ROM: Unrestricted ROM                  Functional ROM: Unrestricted ROM                  Muscle Tone/Strength: Functionally intact. No obvious neuro-muscular anomalies detected.  Muscle Tone/Strength: Functionally intact. No obvious neuro-muscular anomalies detected.  Sensory (Neurological): Neurogenic pain pattern        Sensory (Neurological): Unimpaired        DTR: Patellar: deferred today Achilles: deferred today Plantar: deferred today  DTR: Patellar: deferred today Achilles: deferred today Plantar: deferred today  Palpation: No palpable anomalies  Palpation: No palpable anomalies    Assessment  Primary Diagnosis & Pertinent Problem List: The primary encounter diagnosis was Chronic radicular lumbar pain. Diagnoses of Lumbar foraminal stenosis and Lumbar post-laminectomy syndrome were also pertinent to this visit.  Visit Diagnosis (New problems to  examiner): 1. Chronic radicular lumbar pain   2. Lumbar foraminal stenosis   3. Lumbar post-laminectomy syndrome    Plan of Care (Initial workup plan)  Patient presents with right low back pain with radiation into his right hip, right buttock and right leg in a dermatomal fashion.  He has severe's spinal canal stenosis and severe right neuroforaminal stenosis at L3-4 along with bilateral subarticular stenosis encroaching the descending L5 nerve root with moderate to severe right neuroforaminal stenosis.  I reviewed his lumbar MRI with him in detail.  We discussed a right L3-L4 and L4-L5 transforaminal epidural steroid injection.  Risks and benefits were reviewed and patient would like to proceed.  Procedure Orders         Lumbar Transforaminal Epidural     Provider-requested follow-up: Return in about 18 days (around 11/12/2022) for Right L3/4 and Right L4/5 TF ESI, in clinic (PO Valium).  Future Appointments  Date Time Provider Department Center  11/12/2022  9:40 AM Edward Jolly, MD [facility] None    Duration of encounter: .  Total time on encounter, as per AMA guidelines included both the  face-to-face and non-face-to-face time personally spent by the physician and/or other qualified health care professional(s) on the day of the encounter (includes time in activities that require the physician or other qualified health care professional and does not include time in activities normally performed by clinical staff). Physician's time may include the following activities when performed: Preparing to see the patient (e.g., pre-charting review of records, searching for previously ordered imaging, lab work, and nerve conduction tests) Review of prior analgesic pharmacotherapies. Reviewing PMP Interpreting ordered tests (e.g., lab work, imaging, nerve conduction tests) Performing post-procedure evaluations, including interpretation of diagnostic procedures Obtaining and/or reviewing  separately obtained history Performing a medically appropriate examination and/or evaluation Counseling and educating the patient/family/caregiver Ordering medications, tests, or procedures Referring and communicating with other health care professionals (when not separately reported) Documenting clinical information in the electronic or other health record Independently interpreting results (not separately reported) and communicating results to the patient/ family/caregiver Care coordination (not separately reported)  Note by: Edward Jolly, MD (TTS technology used. I apologize for any typographical errors that were not detected and corrected.) Date: 10/25/2022; Time: 4:07 PM

## 2022-10-25 NOTE — Progress Notes (Signed)
Safety precautions to be maintained throughout the outpatient stay will include: orient to surroundings, keep bed in low position, maintain call bell within reach at all times, provide assistance with transfer out of bed and ambulation.  

## 2022-10-25 NOTE — Patient Instructions (Signed)
GENERAL RISKS AND COMPLICATIONS ° °What are the risk, side effects and possible complications? °Generally speaking, most procedures are safe.  However, with any procedure there are risks, side effects, and the possibility of complications.  The risks and complications are dependent upon the sites that are lesioned, or the type of nerve block to be performed.  The closer the procedure is to the spine, the more serious the risks are.  Great care is taken when placing the radio frequency needles, block needles or lesioning probes, but sometimes complications can occur. °Infection: Any time there is an injection through the skin, there is a risk of infection.  This is why sterile conditions are used for these blocks.  There are four possible types of infection. °Localized skin infection. °Central Nervous System Infection-This can be in the form of Meningitis, which can be deadly. °Epidural Infections-This can be in the form of an epidural abscess, which can cause pressure inside of the spine, causing compression of the spinal cord with subsequent paralysis. This would require an emergency surgery to decompress, and there are no guarantees that the patient would recover from the paralysis. °Discitis-This is an infection of the intervertebral discs.  It occurs in about 1% of discography procedures.  It is difficult to treat and it may lead to surgery. ° °      2. Pain: the needles have to go through skin and soft tissues, will cause soreness. °      3. Damage to internal structures:  The nerves to be lesioned may be near blood vessels or   ° other nerves which can be potentially damaged. °      4. Bleeding: Bleeding is more common if the patient is taking blood thinners such as  aspirin, Coumadin, Ticiid, Plavix, etc., or if he/she have some genetic predisposition  such as hemophilia. Bleeding into the spinal canal can cause compression of the spinal  cord with subsequent paralysis.  This would require an emergency  surgery to  decompress and there are no guarantees that the patient would recover from the  paralysis. °      5. Pneumothorax:  Puncturing of a lung is a possibility, every time a needle is introduced in  the area of the chest or upper back.  Pneumothorax refers to free air around the  collapsed lung(s), inside of the thoracic cavity (chest cavity).  Another two possible  complications related to a similar event would include: Hemothorax and Chylothorax.   These are variations of the Pneumothorax, where instead of air around the collapsed  lung(s), you may have blood or chyle, respectively. °      6. Spinal headaches: They may occur with any procedures in the area of the spine. °      7. Persistent CSF (Cerebro-Spinal Fluid) leakage: This is a rare problem, but may occur  with prolonged intrathecal or epidural catheters either due to the formation of a fistulous  track or a dural tear. °      8. Nerve damage: By working so close to the spinal cord, there is always a possibility of  nerve damage, which could be as serious as a permanent spinal cord injury with  paralysis. °      9. Death:  Although rare, severe deadly allergic reactions known as "Anaphylactic  reaction" can occur to any of the medications used. °     10. Worsening of the symptoms:  We can always make thing worse. ° °What are the chances   of something like this happening? Chances of any of this occuring are extremely low.  By statistics, you have more of a chance of getting killed in a motor vehicle accident: while driving to the hospital than any of the above occurring .  Nevertheless, you should be aware that they are possibilities.  In general, it is similar to taking a shower.  Everybody knows that you can slip, hit your head and get killed.  Does that mean that you should not shower again?  Nevertheless always keep in mind that statistics do not mean anything if you happen to be on the wrong side of them.  Even if a procedure has a 1 (one) in a  1,000,000 (million) chance of going wrong, it you happen to be that one..Also, keep in mind that by statistics, you have more of a chance of having something go wrong when taking medications.  Who should not have this procedure? If you are on a blood thinning medication (e.g. Coumadin, Plavix, see list of "Blood Thinners"), or if you have an active infection going on, you should not have the procedure.  If you are taking any blood thinners, please inform your physician.  How should I prepare for this procedure? Do not eat or drink anything at least six hours prior to the procedure. Bring a driver with you .  It cannot be a taxi. Come accompanied by an adult that can drive you back, and that is strong enough to help you if your legs get weak or numb from the local anesthetic. Take all of your medicines the morning of the procedure with just enough water to swallow them. If you have diabetes, make sure that you are scheduled to have your procedure done first thing in the morning, whenever possible. If you have diabetes, take only half of your insulin dose and notify our nurse that you have done so as soon as you arrive at the clinic. If you are diabetic, but only take blood sugar pills (oral hypoglycemic), then do not take them on the morning of your procedure.  You may take them after you have had the procedure. Do not take aspirin or any aspirin-containing medications, at least eleven (11) days prior to the procedure.  They may prolong bleeding. Wear loose fitting clothing that may be easy to take off and that you would not mind if it got stained with Betadine or blood. Do not wear any jewelry or perfume Remove any nail coloring.  It will interfere with some of our monitoring equipment.  NOTE: Remember that this is not meant to be interpreted as a complete list of all possible complications.  Unforeseen problems may occur.  BLOOD THINNERS The following drugs contain aspirin or other products,  which can cause increased bleeding during surgery and should not be taken for 2 weeks prior to and 1 week after surgery.  If you should need take something for relief of minor pain, you may take acetaminophen which is found in Tylenol,m Datril, Anacin-3 and Panadol. It is not blood thinner. The products listed below are.  Do not take any of the products listed below in addition to any listed on your instruction sheet.  A.P.C or A.P.C with Codeine Codeine Phosphate Capsules #3 Ibuprofen Ridaura  ABC compound Congesprin Imuran rimadil  Advil Cope Indocin Robaxisal  Alka-Seltzer Effervescent Pain Reliever and Antacid Coricidin or Coricidin-D  Indomethacin Rufen  Alka-Seltzer plus Cold Medicine Cosprin Ketoprofen S-A-C Tablets  Anacin Analgesic Tablets or Capsules Coumadin   Korlgesic Salflex  Anacin Extra Strength Analgesic tablets or capsules CP-2 Tablets Lanoril Salicylate  Anaprox Cuprimine Capsules Levenox Salocol  Anexsia-D Dalteparin Magan Salsalate  Anodynos Darvon compound Magnesium Salicylate Sine-off  Ansaid Dasin Capsules Magsal Sodium Salicylate  Anturane Depen Capsules Marnal Soma  APF Arthritis pain formula Dewitt's Pills Measurin Stanback  Argesic Dia-Gesic Meclofenamic Sulfinpyrazone  Arthritis Bayer Timed Release Aspirin Diclofenac Meclomen Sulindac  Arthritis pain formula Anacin Dicumarol Medipren Supac  Analgesic (Safety coated) Arthralgen Diffunasal Mefanamic Suprofen  Arthritis Strength Bufferin Dihydrocodeine Mepro Compound Suprol  Arthropan liquid Dopirydamole Methcarbomol with Aspirin Synalgos  ASA tablets/Enseals Disalcid Micrainin Tagament  Ascriptin Doan's Midol Talwin  Ascriptin A/D Dolene Mobidin Tanderil  Ascriptin Extra Strength Dolobid Moblgesic Ticlid  Ascriptin with Codeine Doloprin or Doloprin with Codeine Momentum Tolectin  Asperbuf Duoprin Mono-gesic Trendar  Aspergum Duradyne Motrin or Motrin IB Triminicin  Aspirin plain, buffered or enteric coated  Durasal Myochrisine Trigesic  Aspirin Suppositories Easprin Nalfon Trillsate  Aspirin with Codeine Ecotrin Regular or Extra Strength Naprosyn Uracel  Atromid-S Efficin Naproxen Ursinus  Auranofin Capsules Elmiron Neocylate Vanquish  Axotal Emagrin Norgesic Verin  Azathioprine Empirin or Empirin with Codeine Normiflo Vitamin E  Azolid Emprazil Nuprin Voltaren  Bayer Aspirin plain, buffered or children's or timed BC Tablets or powders Encaprin Orgaran Warfarin Sodium  Buff-a-Comp Enoxaparin Orudis Zorpin  Buff-a-Comp with Codeine Equegesic Os-Cal-Gesic   Buffaprin Excedrin plain, buffered or Extra Strength Oxalid   Bufferin Arthritis Strength Feldene Oxphenbutazone   Bufferin plain or Extra Strength Feldene Capsules Oxycodone with Aspirin   Bufferin with Codeine Fenoprofen Fenoprofen Pabalate or Pabalate-SF   Buffets II Flogesic Panagesic   Buffinol plain or Extra Strength Florinal or Florinal with Codeine Panwarfarin   Buf-Tabs Flurbiprofen Penicillamine   Butalbital Compound Four-way cold tablets Penicillin   Butazolidin Fragmin Pepto-Bismol   Carbenicillin Geminisyn Percodan   Carna Arthritis Reliever Geopen Persantine   Carprofen Gold's salt Persistin   Chloramphenicol Goody's Phenylbutazone   Chloromycetin Haltrain Piroxlcam   Clmetidine heparin Plaquenil   Cllnoril Hyco-pap Ponstel   Clofibrate Hydroxy chloroquine Propoxyphen         Before stopping any of these medications, be sure to consult the physician who ordered them.  Some, such as Coumadin (Warfarin) are ordered to prevent or treat serious conditions such as "deep thrombosis", "pumonary embolisms", and other heart problems.  The amount of time that you may need off of the medication may also vary with the medication and the reason for which you were taking it.  If you are taking any of these medications, please make sure you notify your pain physician before you undergo any procedures.         Moderate Conscious  Sedation, Adult Sedation is the use of medicines to help you relax and not feel pain. Moderate conscious sedation is a type of sedation that makes you less alert than normal. You are still able to respond to instructions, touch, or both. This type of sedation is used during short medical and dental procedures. It is milder than deep sedation, which is a type of sedation you cannot be easily woken up from. It is also milder than general anesthesia, which is the use of medicines to make you fall asleep. Moderate conscious sedation lets you return to your normal activities sooner. Tell a health care provider about: Any allergies you have. All medicines you are taking, including vitamins, herbs, steroids, eye drops, creams, and over-the-counter medicines. Any problems you or family members have had with anesthesia.  Any bleeding problems you have. Any surgeries you have had. Any medical conditions you have. Whether you are pregnant or may be pregnant. Any recent alcohol, tobacco, or drug use. What are the risks? Your health care provider will talk with you about risks. These may include: Oversedation. This is when you get too much medicine. Nausea or vomiting. Allergic reaction to medicines. Trouble breathing. If this happens, a breathing tube may be used. It will be removed when you can breathe better on your own. Heart trouble. Lung trouble. Emergence delirium. This is when you feel confused while the sedation wears off. This gets better with time. What happens before the procedure? When to stop eating and drinking Follow instructions from your health care provider about what you may eat and drink. These may include: 8 hours before your procedure Stop eating most foods. Do not eat meat, fried foods, or fatty foods. Eat only light foods, such as toast or crackers. All liquids are okay except energy drinks and alcohol. 6 hours before your procedure Stop eating. Drink only clear liquids, such  as water, clear fruit juice, black coffee, plain tea, and sports drinks. Do not drink energy drinks or alcohol. 2 hours before your procedure Stop drinking all liquids. You may be allowed to take medicines with small sips of water. If you do not follow your health care provider's instructions, your procedure may be delayed or canceled. Medicines Ask your health care provider about: Changing or stopping your regular medicines. These include any diabetes medicines or blood thinners you take. Taking medicines such as aspirin and ibuprofen. These medicines can thin your blood. Do not take them unless your health care provider tells you to. Taking over-the-counter medicines, vitamins, herbs, and supplements. Tests and exams You may have an exam or testing. You may have a blood or urine sample taken. General instructions Do not use any products that contain nicotine or tobacco for at least 4 weeks before the procedure. These products include cigarettes, chewing tobacco, and vaping devices, such as e-cigarettes. If you need help quitting, ask your health care provider. If you will be going home right after the procedure, plan to have a responsible adult: Take you home from the hospital or clinic. You will not be allowed to drive. Care for you for the time you are told. What happens during the procedure?  You will be given the sedative. It may be given: As a pill you can take by mouth. It can also be put into the rectum. As a spray through the nose. As an injection into muscle. As an injection into a vein through an IV. You may be given oxygen as needed. Your blood pressure, heart rate, breathing rate, and blood oxygen level will be monitored during the procedure. The medical or dental procedure will be done. The procedure may vary among health care providers and hospitals. What happens after the procedure? Your blood pressure, heart rate, breathing rate, and blood oxygen level will be  monitored until you leave the hospital or clinic. You will get fluids through an IV as needed. Do not drive or operate machinery until your health care provider says that it is safe. This information is not intended to replace advice given to you by your health care provider. Make sure you discuss any questions you have with your health care provider. Document Revised: 08/28/2021 Document Reviewed: 08/28/2021 [employer]vier Patient Education  2024 [employer] Epidural Steroid Injection Patient Information  Description: The epidural space surrounds the nerves as  they exit the spinal cord.  In some patients, the nerves can be compressed and inflamed by a bulging disc or a tight spinal canal (spinal stenosis).  By injecting steroids into the epidural space, we can bring irritated nerves into direct contact with a potentially helpful medication.  These steroids act directly on the irritated nerves and can reduce swelling and inflammation which often leads to decreased pain.  Epidural steroids may be injected anywhere along the spine and from the neck to the low back depending upon the location of your pain.   After numbing the skin with local anesthetic (like Novocaine), a small needle is passed into the epidural space slowly.  You may experience a sensation of pressure while this is being done.  The entire block usually last less than 10 minutes.  Conditions which may be treated by epidural steroids:  Low back and leg pain Neck and arm pain Spinal stenosis Post-laminectomy syndrome Herpes zoster (shingles) pain Pain from compression fractures  Preparation for the injection:  Do not eat any solid food or dairy products within 8 hours of your appointment.  You may drink clear liquids up to 3 hours before appointment.  Clear liquids include water, black coffee, juice or soda.  No milk or cream please. You may take your regular medication, including pain medications, with a sip of water before your  appointment  Diabetics should hold regular insulin (if taken separately) and take 1/2 normal NPH dos the morning of the procedure.  Carry some sugar containing items with you to your appointment. A driver must accompany you and be prepared to drive you home after your procedure.  Bring all your current medications with your. An IV may be inserted and sedation may be given at the discretion of the physician.   A blood pressure cuff, EKG and other monitors will often be applied during the procedure.  Some patients may need to have extra oxygen administered for a short period. You will be asked to provide medical information, including your allergies, prior to the procedure.  We must know immediately if you are taking blood thinners (like Coumadin/Warfarin)  Or if you are allergic to IV iodine contrast (dye). We must know if you could possible be pregnant.  Possible side-effects: Bleeding from needle site Infection (rare, may require surgery) Nerve injury (rare) Numbness & tingling (temporary) Difficulty urinating (rare, temporary) Spinal headache ( a headache worse with upright posture) Light -headedness (temporary) Pain at injection site (several days) Decreased blood pressure (temporary) Weakness in arm/leg (temporary) Pressure sensation in back/neck (temporary)  Call if you experience: Fever/chills associated with headache or increased back/neck pain. Headache worsened by an upright position. New onset weakness or numbness of an extremity below the injection site Hives or difficulty breathing (go to the emergency room) Inflammation or drainage at the infection site Severe back/neck pain Any new symptoms which are concerning to you  Please note:  Although the local anesthetic injected can often make your back or neck feel good for several hours after the injection, the pain will likely return.  It takes 3-7 days for steroids to work in the epidural space.  You may not notice any pain  relief for at least that one week.  If effective, we will often do a series of three injections spaced 3-6 weeks apart to maximally decrease your pain.  After the initial series, we generally will wait several months before considering a repeat injection of the same type.  If you have any  questions, please call [phone] [facility]

## 2022-11-05 ENCOUNTER — Encounter: Payer: Self-pay | Admitting: Student in an Organized Health Care Education/Training Program

## 2022-11-12 ENCOUNTER — Ambulatory Visit
Admission: RE | Admit: 2022-11-12 | Discharge: 2022-11-12 | Disposition: A | Discharge status: Home / Self Care | Payer: BC Managed Care – PPO | Source: Ambulatory Visit | Attending: Student in an Organized Health Care Education/Training Program | Admitting: Student in an Organized Health Care Education/Training Program

## 2022-11-12 ENCOUNTER — Ambulatory Visit
Payer: BC Managed Care – PPO | Attending: Student in an Organized Health Care Education/Training Program | Admitting: Student in an Organized Health Care Education/Training Program

## 2022-11-12 ENCOUNTER — Encounter: Payer: Self-pay | Admitting: Student in an Organized Health Care Education/Training Program

## 2022-11-12 VITALS — BP 133/94 | HR 88 | Temp 97.7°F | Resp 14 | Ht 71.0 in | Wt 184.0 lb

## 2022-11-12 DIAGNOSIS — M5416 Radiculopathy, lumbar region: Secondary | ICD-10-CM | POA: Diagnosis present

## 2022-11-12 DIAGNOSIS — G8929 Other chronic pain: Secondary | ICD-10-CM | POA: Diagnosis present

## 2022-11-12 DIAGNOSIS — M48061 Spinal stenosis, lumbar region without neurogenic claudication: Secondary | ICD-10-CM | POA: Diagnosis not present

## 2022-11-12 DIAGNOSIS — M961 Postlaminectomy syndrome, not elsewhere classified: Secondary | ICD-10-CM | POA: Diagnosis present

## 2022-11-12 MED ORDER — DIAZEPAM 5 MG PO TABS
ORAL_TABLET | ORAL | Status: AC
  Filled 2022-11-12: qty 1

## 2022-11-12 MED ORDER — LIDOCAINE HCL 2 % IJ SOLN
20.0000 mL | Freq: Once | INTRAMUSCULAR | Status: AC
  Administered 2022-11-12: 200 mg
  Filled 2022-11-12: qty 20

## 2022-11-12 MED ORDER — IOHEXOL 180 MG/ML  SOLN
10.0000 mL | Freq: Once | INTRAMUSCULAR | Status: AC
  Administered 2022-11-12: 10 mL via EPIDURAL
  Filled 2022-11-12: qty 20

## 2022-11-12 MED ORDER — DEXAMETHASONE SODIUM PHOSPHATE 10 MG/ML IJ SOLN
20.0000 mg | Freq: Once | INTRAMUSCULAR | Status: AC
  Administered 2022-11-12: 20 mg
  Filled 2022-11-12: qty 2

## 2022-11-12 MED ORDER — SODIUM CHLORIDE (PF) 0.9 % IJ SOLN
INTRAMUSCULAR | Status: AC
  Filled 2022-11-12: qty 10

## 2022-11-12 MED ORDER — ROPIVACAINE HCL 2 MG/ML IJ SOLN
2.0000 mL | Freq: Once | INTRAMUSCULAR | Status: AC
  Administered 2022-11-12: 2 mL via EPIDURAL
  Filled 2022-11-12: qty 20

## 2022-11-12 MED ORDER — SODIUM CHLORIDE 0.9% FLUSH
2.0000 mL | Freq: Once | INTRAVENOUS | Status: AC
  Administered 2022-11-12: 2 mL

## 2022-11-12 MED ORDER — DIAZEPAM 5 MG PO TABS
5.0000 mg | ORAL_TABLET | ORAL | Status: AC
  Administered 2022-11-12: 5 mg via ORAL

## 2022-11-12 NOTE — Progress Notes (Signed)
PROVIDER NOTE: Interpretation of information contained herein should be left to medically-trained personnel. Specific patient instructions are provided elsewhere under "Patient Instructions" section of medical record. This document was created in part using STT-dictation technology, any transcriptional errors that may result from this process are unintentional.  Patient: Bryce Medina Type: Established DOB: December 18, 1965 MRN: [id number] PCP: Kandyce Rud, MD  Service: Procedure DOS: 11/12/2022 Setting: Ambulatory Location: Ambulatory outpatient facility Delivery: Face-to-face Provider: Edward Jolly, MD Specialty: Interventional Pain Management Specialty designation: 09 Location: Outpatient facility Ref. Prov.: Kandyce Rud, MD       Interventional Therapy   Procedure: Lumbar trans-foraminal epidural steroid injection (L-TFESI) #1  Laterality: Right (-RT)  Level: L3/L4 and L4/5 Imaging: Fluoroscopy-guided         Anesthesia: Local anesthesia (1-2% Lidocaine) Sedation: Minimal Sedation                       DOS: 11/12/2022  Performed by: Edward Jolly, MD  Purpose: Diagnostic/Therapeutic Indications: Lumbar radicular pain severe enough to impact quality of life or function. 1. Chronic radicular lumbar pain   2. Lumbar foraminal stenosis   3. Lumbar post-laminectomy syndrome    NAS-11 Pain score:   Pre-procedure: 3 /10   Post-procedure: 1 /10     Position / Prep / Materials:  Position: Prone  Prep solution: DuraPrep (Iodine Povacrylex [0.7% available iodine] and Isopropyl Alcohol, 74% w/w) Prep Area: Entire Posterior Lumbosacral Area.  From the lower tip of the scapula down to the tailbone and from flank to flank. Materials:  Tray: Block Needle(s):  Type: Spinal  Gauge (G): 22  Length: 3.5-in  Qty: 2      H&P (Pre-op Assessment):  Mr. Vaid is a 57 y.o. (year old), male patient, seen today for interventional treatment. He  has a past surgical history that includes  Hernia repair; left shoulder surgery  (Left); Appendectomy; Cystoscopy with retrograde pyelogram, ureteroscopy and stent placement (N/A, 07/04/2016); Holmium laser application (Right, 07/04/2016); Lumbar laminectomy/decompression microdiscectomy (N/A, 12/19/2016); and Extracorporeal shock wave lithotripsy (Left, 09/04/2018). Mr. Gotthardt has a current medication list which includes the following prescription(s): calcipotriene-betamethasone, enstilar, epinephrine, levocetirizine, omeprazole, tadalafil, telmisartan, bupropion, and wixela inhub. His primarily concern today is the Back Pain (Right, lower)  Initial Vital Signs:  Pulse/HCG Rate: 88ECG Heart Rate: 92 Temp: 97.7 F (36.5 C) Resp: 14 BP: (!) 130/91 SpO2: 100 %  BMI: Estimated body mass index is 25.66 kg/m as calculated from the following:   Height as of this encounter: 5\' 11"  (1.803 m).   Weight as of this encounter: 184 lb (83.5 kg).  Risk Assessment: Allergies: Reviewed. He is allergic to bee venom.  Allergy Precautions: None required Coagulopathies: Reviewed. None identified.  Blood-thinner therapy: None at this time Active Infection(s): Reviewed. None identified. Mr. Valerio is afebrile  Site Confirmation: Mr. Spata was asked to confirm the procedure and laterality before marking the site Procedure checklist: Completed Consent: Before the procedure and under the influence of no sedative(s), amnesic(s), or anxiolytics, the patient was informed of the treatment options, risks and possible complications. To fulfill our ethical and legal obligations, as recommended by the American Medical Association's Code of Ethics, I have informed the patient of my clinical impression; the nature and purpose of the treatment or procedure; the risks, benefits, and possible complications of the intervention; the alternatives, including doing nothing; the risk(s) and benefit(s) of the alternative treatment(s) or procedure(s); and the risk(s) and benefit(s)  of doing nothing. The patient was provided  information about the general risks and possible complications associated with the procedure. These may include, but are not limited to: failure to achieve desired goals, infection, bleeding, organ or nerve damage, allergic reactions, paralysis, and death. In addition, the patient was informed of those risks and complications associated to Spine-related procedures, such as failure to decrease pain; infection (i.e.: Meningitis, epidural or intraspinal abscess); bleeding (i.e.: epidural hematoma, subarachnoid hemorrhage, or any other type of intraspinal or peri-dural bleeding); organ or nerve damage (i.e.: Any type of peripheral nerve, nerve root, or spinal cord injury) with subsequent damage to sensory, motor, and/or autonomic systems, resulting in permanent pain, numbness, and/or weakness of one or several areas of the body; allergic reactions; (i.e.: anaphylactic reaction); and/or death. Furthermore, the patient was informed of those risks and complications associated with the medications. These include, but are not limited to: allergic reactions (i.e.: anaphylactic or anaphylactoid reaction(s)); adrenal axis suppression; blood sugar elevation that in diabetics may result in ketoacidosis or comma; water retention that in patients with history of congestive heart failure may result in shortness of breath, pulmonary edema, and decompensation with resultant heart failure; weight gain; swelling or edema; medication-induced neural toxicity; particulate matter embolism and blood vessel occlusion with resultant organ, and/or nervous system infarction; and/or aseptic necrosis of one or more joints. Finally, the patient was informed that Medicine is not an exact science; therefore, there is also the possibility of unforeseen or unpredictable risks and/or possible complications that may result in a catastrophic outcome. The patient indicated having understood very clearly. We  have given the patient no guarantees and we have made no promises. Enough time was given to the patient to ask questions, all of which were answered to the patient's satisfaction. Mr. Alwardt has indicated that he wanted to continue with the procedure. Attestation: I, the ordering provider, attest that I have discussed with the patient the benefits, risks, side-effects, alternatives, likelihood of achieving goals, and potential problems during recovery for the procedure that I have provided informed consent. Date  Time: 11/12/2022  9:36 AM   Pre-Procedure Preparation:  Monitoring: As per clinic protocol. Respiration, ETCO2, SpO2, BP, heart rate and rhythm monitor placed and checked for adequate function Safety Precautions: Patient was assessed for positional comfort and pressure points before starting the procedure. Time-out: I initiated and conducted the "Time-out" before starting the procedure, as per protocol. The patient was asked to participate by confirming the accuracy of the "Time Out" information. Verification of the correct person, site, and procedure were performed and confirmed by me, the nursing staff, and the patient. "Time-out" conducted as per Joint Commission's Universal Protocol (UP.01.01.01). Time: 1013 Start Time: 1013 hrs.  Description/Narrative of Procedure:          Target: The 6 o'clock position under the pedicle, on the affected side. Region: Posterolateral Lumbosacral Approach: Posterior Percutaneous Paravertebral approach.  Rationale (medical necessity): procedure needed and proper for the diagnosis and/or treatment of the patient's medical symptoms and needs. Procedural Technique Safety Precautions: Aspiration looking for blood return was conducted prior to all injections. At no point did we inject any substances, as a needle was being advanced. No attempts were made at seeking any paresthesias. Safe injection practices and needle disposal techniques used. Medications  properly checked for expiration dates. SDV (single dose vial) medications used. Description of the Procedure: Protocol guidelines were followed. The patient was placed in position over the procedure table. The target area was identified and the area prepped in the usual manner. Skin &  deeper tissues infiltrated with local anesthetic. Appropriate amount of time allowed to pass for local anesthetics to take effect. The procedure needles were then advanced to the target area. Proper needle placement secured. Negative aspiration confirmed. Solution injected in intermittent fashion, asking for systemic symptoms every 0.5cc of injectate. The needles were then removed and the area cleansed, making sure to leave some of the prepping solution back to take advantage of its long term bactericidal properties.  Vitals:   11/12/22 1014 11/12/22 1019 11/12/22 1023 11/12/22 1028  BP: (!) 141/101 (!) 145/104 (!) 140/105 (!) 133/94  Pulse:      Resp: 13 13 14    Temp:      TempSrc:      SpO2: 99% 100% 100%   Weight:      Height:        Start Time: 1013 hrs. End Time: 1022 hrs.  Imaging Guidance (Spinal):          Type of Imaging Technique: Fluoroscopy Guidance (Spinal) Indication(s): Assistance in needle guidance and placement for procedures requiring needle placement in or near specific anatomical locations not easily accessible without such assistance. Exposure Time: Please see nurses notes. Contrast: Before injecting any contrast, we confirmed that the patient did not have an allergy to iodine, shellfish, or radiological contrast. Once satisfactory needle placement was completed at the desired level, radiological contrast was injected. Contrast injected under live fluoroscopy. No contrast complications. See chart for type and volume of contrast used. Fluoroscopic Guidance: I was personally present during the use of fluoroscopy. "Tunnel Vision Technique" used to obtain the best possible view of the target  area. Parallax error corrected before commencing the procedure. "Direction-depth-direction" technique used to introduce the needle under continuous pulsed fluoroscopy. Once target was reached, antero-posterior, oblique, and lateral fluoroscopic projection used confirm needle placement in all planes. Images permanently stored in EMR. Interpretation: I personally interpreted the imaging intraoperatively. Adequate needle placement confirmed in multiple planes. Appropriate spread of contrast into desired area was observed. No evidence of afferent or efferent intravascular uptake. No intrathecal or subarachnoid spread observed. Permanent images saved into the patient's record.  Post-operative Assessment:  Post-procedure Vital Signs:  Pulse/HCG Rate: 8889 Temp: 97.7 F (36.5 C) Resp: 14 BP: (!) 133/94 SpO2: 100 %  EBL: None  Complications: No immediate post-treatment complications observed by team, or reported by patient.  Note: The patient tolerated the entire procedure well. A repeat set of vitals were taken after the procedure and the patient was kept under observation following institutional policy, for this type of procedure. Post-procedural neurological assessment was performed, showing return to baseline, prior to discharge. The patient was provided with post-procedure discharge instructions, including a section on how to identify potential problems. Should any problems arise concerning this procedure, the patient was given instructions to immediately contact us, at any time, without hesitation. In any case, we plan to contact the patient by telephone for a follow-up status report regarding this interventional procedure.  Comments:  No additional relevant information.  Plan of Care (POC)  Orders:  Orders Placed This Encounter  Procedures   DG PAIN CLINIC C-ARM 1-60 MIN NO REPORT    Intraoperative interpretation by procedural physician at [facility].    Standing Status:    Standing    Number of Occurrences:   1    Order Specific Question:   Reason for exam:    Answer:   Assistance in needle guidance and placement for procedures requiring needle placement in or near  specific anatomical locations not easily accessible without such assistance.    Medications ordered for procedure: Meds ordered this encounter  Medications   iohexol (OMNIPAQUE) 180 MG/ML injection 10 mL    Must be Myelogram-compatible. If not available, you may substitute with a water-soluble, non-ionic, hypoallergenic, myelogram-compatible radiological contrast medium.   lidocaine (XYLOCAINE) 2 % (with pres) injection 400 mg   diazepam (VALIUM) tablet 5 mg    Make sure Flumazenil is available in the pyxis when using this medication. If oversedation occurs, administer 0.2 mg IV over 15 sec. If after 45 sec no response, administer 0.2 mg again over 1 min; may repeat at 1 min intervals; not to exceed 4 doses (1 mg)   sodium chloride flush (NS) 0.9 % injection 2 mL    This is for a two (2) level block. Use two (2) syringes and divide content in half.   ropivacaine (PF) 2 mg/mL (0.2%) (NAROPIN) injection 2 mL    This is for a two (2) level block. Use two (2) syringes and divide content in half.   dexamethasone (DECADRON) injection 20 mg    This is for a two (2) level block. Use two (2) syringes and divide content in half.   Medications administered: We administered iohexol, lidocaine, diazepam, sodium chloride flush, ropivacaine (PF) 2 mg/mL (0.2%), and dexamethasone.  See the medical record for exact dosing, route, and time of administration.  Follow-up plan:   Return in about 3 weeks (around 12/03/2022) for PPE, F2F.      Recent Visits Date Type Provider Dept  10/25/22 Office Visit Edward Jolly, MD [facility]  Showing recent visits within past 90 days and meeting all other requirements Today's Visits Date Type Provider Dept  11/12/22 Procedure visit Edward Jolly, MD [facility]  Showing today's visits and meeting all other requirements Future Appointments Date Type Provider Dept  12/10/22 Appointment Edward Jolly, MD [facility]  Showing future appointments within next 90 days and meeting all other requirements  Disposition: Discharge home  Discharge (Date  Time): 11/12/2022; 1030 hrs.   Primary Care Physician: Kandyce Rud, MD Location: [facility] Note by: Edward Jolly, MD (TTS technology used. I apologize for any typographical errors that were not detected and corrected.) Date: 11/12/2022; Time: 11:39 AM  Disclaimer:  Medicine is not an Visual merchandiser. The only guarantee in medicine is that nothing is guaranteed. It is important to note that the decision to proceed with this intervention was based on the information collected from the patient. The Data and conclusions were drawn from the patient's questionnaire, the interview, and the physical examination. Because the information was provided in large part by the patient, it cannot be guaranteed that it has not been purposely or unconsciously manipulated. Every effort has been made to obtain as much relevant data as possible for this evaluation. It is important to note that the conclusions that lead to this procedure are derived in large part from the available data. Always take into account that the treatment will also be dependent on availability of resources and existing treatment guidelines, considered by other Pain Management Practitioners as being common knowledge and practice, at the time of the intervention. For Medico-Legal purposes, it is also important to point out that variation in procedural techniques and pharmacological choices are the acceptable norm. The indications, contraindications, technique, and results of the above procedure should only be interpreted and judged by a Board-Certified Interventional Pain Specialist with extensive familiarity and  expertise in the same exact procedure and technique.

## 2022-11-12 NOTE — Patient Instructions (Signed)

## 2022-11-13 ENCOUNTER — Telehealth: Payer: Self-pay

## 2022-11-13 NOTE — Telephone Encounter (Signed)
Post procedure follow up.  Patient states he is doing fine.  

## 2022-12-10 ENCOUNTER — Ambulatory Visit
Payer: BC Managed Care – PPO | Attending: Student in an Organized Health Care Education/Training Program | Admitting: Student in an Organized Health Care Education/Training Program

## 2022-12-10 ENCOUNTER — Encounter: Payer: Self-pay | Admitting: Student in an Organized Health Care Education/Training Program

## 2022-12-10 VITALS — BP 132/91 | HR 92 | Temp 97.3°F | Resp 16 | Ht 71.0 in | Wt 184.0 lb

## 2022-12-10 DIAGNOSIS — M961 Postlaminectomy syndrome, not elsewhere classified: Secondary | ICD-10-CM | POA: Insufficient documentation

## 2022-12-10 DIAGNOSIS — M48061 Spinal stenosis, lumbar region without neurogenic claudication: Secondary | ICD-10-CM | POA: Diagnosis present

## 2022-12-10 DIAGNOSIS — G8929 Other chronic pain: Secondary | ICD-10-CM | POA: Insufficient documentation

## 2022-12-10 DIAGNOSIS — M5416 Radiculopathy, lumbar region: Secondary | ICD-10-CM | POA: Diagnosis present

## 2022-12-10 NOTE — Progress Notes (Signed)
PROVIDER NOTE: Information contained herein reflects review and annotations entered in association with encounter. Interpretation of such information and data should be left to medically-trained personnel. Information provided to patient can be located elsewhere in the medical record under "Patient Instructions". Document created using STT-dictation technology, any transcriptional errors that may result from process are unintentional.    Patient: Bryce Medina  Service Category: E/M  Provider: Edward Jolly, MD  DOB: Aug 28, 1965  DOS: 12/10/2022  Referring Provider: Kandyce Rud, MD  MRN: [id number]  Specialty: Interventional Pain Management  PCP: Kandyce Rud, MD  Type: Established Patient  Setting: Ambulatory outpatient    Location: Office  Delivery: Face-to-face     HPI  Bryce Medina, a 57 y.o. year old male, is here today because of his Chronic radicular lumbar pain [M54.16, G89.29]. Bryce Medina primary complain today is Back Pain   Pain Assessment: Severity of   is reported as a 4 /10. Location: Back Lower/radiates into buttocks. Onset: More than a month ago. Quality: Sharp. Timing: Intermittent. Modifying factor(s): rest. Vitals:  height is 5\' 11"  (1.803 m) and weight is 184 lb (83.5 kg). His temperature is 97.3 F (36.3 C) (abnormal). His blood pressure is 132/91 (abnormal) and his pulse is 92. His respiration is 16 and oxygen saturation is 100%.  BMI: Estimated body mass index is 25.66 kg/m as calculated from the following:   Height as of this encounter: 5\' 11"  (1.803 m).   Weight as of this encounter: 184 lb (83.5 kg). Last encounter: 10/25/2022. Last procedure: 11/12/2022.  Reason for encounter: post-procedure evaluation and assessment.    Post-procedure evaluation   Procedure: Lumbar trans-foraminal epidural steroid injection (L-TFESI) #1  Laterality: Right (-RT)  Level: L3/L4 and L4/5 Imaging: Fluoroscopy-guided         Anesthesia: Local anesthesia (1-2%  Lidocaine) Sedation: Minimal Sedation                       DOS: 11/12/2022  Performed by: Edward Jolly, MD  Purpose: Diagnostic/Therapeutic Indications: Lumbar radicular pain severe enough to impact quality of life or function. 1. Chronic radicular lumbar pain   2. Lumbar foraminal stenosis   3. Lumbar post-laminectomy syndrome    NAS-11 Pain score:   Pre-procedure: 3 /10   Post-procedure: 1 /10      Effectiveness:  Initial hour after procedure: 0 %  Subsequent 4-6 hours post-procedure: 0 %  Analgesia past initial 6 hours: 0 %  Ongoing improvement:  Analgesic:  0%    ROS  Constitutional: Denies any fever or chills Gastrointestinal: No reported hemesis, hematochezia, vomiting, or acute GI distress Musculoskeletal:  low back, right buttock and leg pain Neurological: No reported episodes of acute onset apraxia, aphasia, dysarthria, agnosia, amnesia, paralysis, loss of coordination, or loss of consciousness  Medication Review  Calcipotriene-Betameth Diprop, EPINEPHrine, buPROPion, calcipotriene-betamethasone, fluticasone-salmeterol, levocetirizine, omeprazole, tadalafil, and telmisartan  History Review  Allergy: Bryce Medina is allergic to bee venom. Drug: Bryce Medina  reports no history of drug use. Alcohol:  reports no history of alcohol use. Tobacco:  reports that he has never smoked. He has never used smokeless tobacco. Social: Bryce Medina  reports that he has never smoked. He has never used smokeless tobacco. He reports that he does not drink alcohol and does not use drugs. Medical:  has a past medical history of Arthritis, Broken ribs, GERD (gastroesophageal reflux disease), History of kidney stones, Pneumothorax on right (11/2013), PONV (postoperative nausea and vomiting), and Psoriasis. Surgical:  Bryce Medina  has a past surgical history that includes Hernia repair; left shoulder surgery  (Left); Appendectomy; Cystoscopy with retrograde pyelogram, ureteroscopy and stent  placement (N/A, 07/04/2016); Holmium laser application (Right, 07/04/2016); Lumbar laminectomy/decompression microdiscectomy (N/A, 12/19/2016); and Extracorporeal shock wave lithotripsy (Left, 09/04/2018). Family: family history includes Diabetes in his maternal grandmother; Hypertension in his father.  Laboratory Chemistry Profile   Renal Lab Results  Component Value Date   BUN 20 09/02/2018   CREATININE 1.86 (H) 09/02/2018   GFRAA 48 (L) 09/02/2018   GFRNONAA 41 (L) 09/02/2018    Hepatic Lab Results  Component Value Date   AST 23 09/02/2018   ALT 31 09/02/2018   ALBUMIN 4.1 09/02/2018   ALKPHOS 87 09/02/2018   LIPASE 27 09/02/2018    Electrolytes Lab Results  Component Value Date   NA 141 11/12/2018   K 4.3 09/02/2018   CL 103 09/02/2018   CALCIUM 9.5 09/02/2018    Bone No results found for: "VD25OH", "VD125OH2TOT", "[id number]", "[id number]", "25OHVITD1", "25OHVITD2", "25OHVITD3", "TESTOFREE", "TESTOSTERONE"  Inflammation (CRP: Acute Phase) (ESR: Chronic Phase) No results found for: "CRP", "ESRSEDRATE", "LATICACIDVEN"       Note: Above Lab results reviewed.  Recent Imaging Review  MR LUMBAR SPINE WO CONTRAST   Narrative CLINICAL DATA:  Lumbar radiculopathy, prior surgery, new symptoms   EXAM: MRI LUMBAR SPINE WITHOUT CONTRAST   TECHNIQUE: Multiplanar, multisequence MR imaging of the lumbar spine was performed. No intravenous contrast was administered.   COMPARISON:  Lumbar spine radiograph 07/03/2022, CT 09/02/2018, MRI 12/26/2011   FINDINGS: Segmentation:  Standard.   Alignment: Trace degenerative anterolisthesis at L3-L4 and retrolisthesis at L4-L5 and L5-S1.   Vertebrae: No evidence of acute fracture, discitis, or aggressive osseous lesion. There is mild marrow edema with preserved T1 signal within the S3, S4, and partially visualized S5 segments centrally, nonspecific. This was present in October 2013 to a lesser degree within S3. No CT correlate in July  2020.   Conus medullaris and cauda equina: Conus extends to the L1 level. Conus and cauda equina appear normal.   Paraspinal and other soft tissues: Minimal right lower paraspinal muscle edema, nonspecific.   Disc levels:   T12-L1: No significant spinal canal or neural foraminal narrowing.   L1-L2: No significant spinal canal or neural foraminal narrowing.   L2-L3: Asymmetric right disc bulging, with ligament flavum hypertrophy and mild facet arthropathy results in moderate spinal canal stenosis and minimal right neural foraminal narrowing.   L3-L4: Trace degenerative anterolisthesis with broad disc bulging, ligament flavum hypertrophy and mild facet arthropathy with trace effusions moderate severe spinal canal stenosis with moderate-severe right and mild-to-moderate left neural foraminal stenosis.   L4-L5: Trace degenerative retrolisthesis with broad, asymmetric right disc bulging, with ligament flavum hypertrophy and mild facet arthropathy. There is bilateral subarticular stenosis encroaching the descending L5 nerve roots. There is moderate-severe right and minimal left neural foraminal stenosis.   L5-S1: Trace degenerative retrolisthesis with broad disc bulging, ligamentum flavum thickening and left greater than right facet arthropathy. There is left greater than right subarticular stenosis with potential impingement of the descending left S1 nerve root. Mild-to-moderate bilateral neural foraminal narrowing.   IMPRESSION: Multilevel degenerative changes of the lumbar spine, progressed since October 2013.   L2-L3: Moderate spinal canal stenosis. Minimal right neural foraminal narrowing.   L3-L4: Moderate-severe spinal canal stenosis. Moderate-severe right and mild-to-moderate left neural foraminal stenosis.   L4-L5: Bilateral subarticular stenosis encroaching the descending L5 nerve roots. Moderate-severe right and minimal left neural foraminal stenosis.  L5-S1:  Left greater than right subarticular stenosis with potential impingement of the descending left S1 nerve root. Mild-to-moderate bilateral neural foraminal narrowing.   Mild marrow edema signal within the S3, S4, and partially visualized S5 segments centrally, specific but progressive since October 2013. This is favored to reflect a chronic stress reaction.     Electronically Signed By: Caprice Renshaw M.D. On: 07/17/2022 13:23   Note: Reviewed        Physical Exam  General appearance: Well nourished, well developed, and well hydrated. In no apparent acute distress Mental status: Alert, oriented x 3 (person, place, & time)       Respiratory: No evidence of acute respiratory distress Eyes: PERLA Vitals: BP (!) 132/91   Pulse 92   Temp (!) 97.3 F (36.3 C)   Resp 16   Ht 5\' 11"  (1.803 m)   Wt 184 lb (83.5 kg)   SpO2 100%   BMI 25.66 kg/m  BMI: Estimated body mass index is 25.66 kg/m as calculated from the following:   Height as of this encounter: 5\' 11"  (1.803 m).   Weight as of this encounter: 184 lb (83.5 kg). Ideal: Ideal body weight: 75.3 kg (166 lb 0.1 oz) Adjusted ideal body weight: 78.6 kg (173 lb 3.3 oz)  Lumbar Spine Area Exam  Skin & Axial Inspection: Well healed scar from previous spine surgery detected Alignment: Symmetrical Functional ROM: Pain restricted ROM affecting primarily the right Stability: No instability detected Muscle Tone/Strength: Functionally intact. No obvious neuro-muscular anomalies detected. Sensory (Neurological): Dermatomal pain pattern   Gait & Posture Assessment  Ambulation: Unassisted Gait: Relatively normal for age and body habitus Posture: WNL  Lower Extremity Exam      Side: Right lower extremity   Side: Left lower extremity  Stability: No instability observed           Stability: No instability observed          Skin & Extremity Inspection: Skin color, temperature, and hair growth are WNL. No peripheral edema or cyanosis. No  masses, redness, swelling, asymmetry, or associated skin lesions. No contractures.   Skin & Extremity Inspection: Skin color, temperature, and hair growth are WNL. No peripheral edema or cyanosis. No masses, redness, swelling, asymmetry, or associated skin lesions. No contractures.  Functional ROM: Unrestricted ROM                   Functional ROM: Unrestricted ROM                  Muscle Tone/Strength: Functionally intact. No obvious neuro-muscular anomalies detected.   Muscle Tone/Strength: Functionally intact. No obvious neuro-muscular anomalies detected.  Sensory (Neurological): Neurogenic pain pattern         Sensory (Neurological): Unimpaired        DTR: Patellar: deferred today Achilles: deferred today Plantar: deferred today   DTR: Patellar: deferred today Achilles: deferred today Plantar: deferred today  Palpation: No palpable anomalies   Palpation: No palpable anomalies    Assessment   Diagnosis Status  1. Chronic radicular lumbar pain   2. Lumbar foraminal stenosis   3. Lumbar post-laminectomy syndrome    Persistent Persistent Persistent   Updated Problems: No problems updated.  Plan of Care  Unfortunately, Jamesjoseph continues to struggle with low back pain that radiates into his right buttock and right leg.  He has moderate to severe right neuroforaminal stenosis at L3-L4 along with moderate to severe right neuroforaminal stenosis at L4-L5.  He did not  respond to his previous right L3-L4, L4-L5 transforaminal ESI.  Today we discussed spinal cord stimulation.  I used a spine model to explain what the trial leads would look like as well as the IPG.  I also recommend that he see Dr. Marcell Barlow to further discuss surgery versus spinal cord stimulation.  If patient elects to proceed with spinal cord stimulation, I am happy to see him back for a spinal cord stimulator trial.  Will need a thoracic MRI as well as psych eval.    Follow-up plan:   Return for Patient will let me know  if he wants to proceed with SCS trial after seeing Dr. Marcell Barlow.      Recent Visits Date Type Provider Dept  11/12/22 Procedure visit Edward Jolly, MD [facility]  10/25/22 Office Visit Edward Jolly, MD [facility]  Showing recent visits within past 90 days and meeting all other requirements Today's Visits Date Type Provider Dept  12/10/22 Office Visit Edward Jolly, MD [facility]  Showing today's visits and meeting all other requirements Future Appointments No visits were found meeting these conditions. Showing future appointments within next 90 days and meeting all other requirements  I discussed the assessment and treatment plan with the patient. The patient was provided an opportunity to ask questions and all were answered. The patient agreed with the plan and demonstrated an understanding of the instructions.  Patient advised to call back or seek an in-person evaluation if the symptoms or condition worsens.  Duration of encounter: .  Total time on encounter, as per AMA guidelines included both the face-to-face and non-face-to-face time personally spent by the physician and/or other qualified health care professional(s) on the day of the encounter (includes time in activities that require the physician or other qualified health care professional and does not include time in activities normally performed by clinical staff). Physician's time may include the following activities when performed: Preparing to see the patient (e.g., pre-charting review of records, searching for previously ordered imaging, lab work, and nerve conduction tests) Review of prior analgesic pharmacotherapies. Reviewing PMP Interpreting ordered tests (e.g., lab work, imaging, nerve conduction tests) Performing post-procedure evaluations, including interpretation of diagnostic procedures Obtaining and/or reviewing separately obtained history Performing a medically  appropriate examination and/or evaluation Counseling and educating the patient/family/caregiver Ordering medications, tests, or procedures Referring and communicating with other health care professionals (when not separately reported) Documenting clinical information in the electronic or other health record Independently interpreting results (not separately reported) and communicating results to the patient/ family/caregiver Care coordination (not separately reported)  Note by: Edward Jolly, MD Date: 12/10/2022; Time: 3:24 PM

## 2022-12-10 NOTE — Progress Notes (Signed)
Safety precautions to be maintained throughout the outpatient stay will include: orient to surroundings, keep bed in low position, maintain call bell within reach at all times, provide assistance with transfer out of bed and ambulation.  

## 2022-12-18 ENCOUNTER — Ambulatory Visit: Payer: BC Managed Care – PPO | Admitting: Neurosurgery

## 2022-12-18 ENCOUNTER — Encounter: Payer: Self-pay | Admitting: Neurosurgery

## 2022-12-18 VITALS — BP 146/94 | Ht 71.0 in | Wt 184.0 lb

## 2022-12-18 DIAGNOSIS — M4316 Spondylolisthesis, lumbar region: Secondary | ICD-10-CM | POA: Diagnosis not present

## 2022-12-18 DIAGNOSIS — M5442 Lumbago with sciatica, left side: Secondary | ICD-10-CM

## 2022-12-18 DIAGNOSIS — M532X6 Spinal instabilities, lumbar region: Secondary | ICD-10-CM

## 2022-12-18 DIAGNOSIS — M48062 Spinal stenosis, lumbar region with neurogenic claudication: Secondary | ICD-10-CM | POA: Diagnosis not present

## 2022-12-18 DIAGNOSIS — M47819 Spondylosis without myelopathy or radiculopathy, site unspecified: Secondary | ICD-10-CM

## 2022-12-18 DIAGNOSIS — M5441 Lumbago with sciatica, right side: Secondary | ICD-10-CM

## 2022-12-18 DIAGNOSIS — G8929 Other chronic pain: Secondary | ICD-10-CM
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
## 2024-02-03 ENCOUNTER — Ambulatory Visit: Payer: Self-pay | Admitting: General Surgery

## 2024-02-04 ENCOUNTER — Inpatient Hospital Stay
Admission: RE | Admit: 2024-02-04 | Discharge: 2024-02-04 | Disposition: A | Source: Ambulatory Visit | Attending: General Surgery

## 2024-02-04 ENCOUNTER — Other Ambulatory Visit: Payer: Self-pay

## 2024-02-04 HISTORY — DX: Unilateral inguinal hernia, without obstruction or gangrene, not specified as recurrent: K40.90

## 2024-02-04 NOTE — Patient Instructions (Addendum)
 Your procedure is scheduled on: 02/05/24 Report to the Registration Desk on the 1st floor of the [facility].  REMEMBER: Instructions that are not followed completely may result in serious medical risk, up to and including death; or upon the discretion of your surgeon and anesthesiologist your surgery may need to be rescheduled.  Do not eat food after midnight the night before surgery.  No gum chewing or hard candies.  One week prior to surgery: Stop Anti-inflammatories (NSAIDS) such as Advil, Aleve, Ibuprofen, Motrin, Naproxen, Naprosyn and Aspirin based products such as Excedrin, Goody's Powder, BC Powder.  Stop ANY OVER THE COUNTER supplements until after surgery.  STOP tadalafil  (CIALIS ) 2 days before surgery.  You may however, continue to take Tylenol  if needed for pain up until the day of surgery.  Continue taking all of your other prescription medications up until the day of surgery.  ON THE DAY OF SURGERY ONLY TAKE THESE MEDICATIONS WITH SIPS OF WATER:  buPROPion (WELLBUTRIN XL)  escitalopram (LEXAPRO)  omeprazole (PRILOSEC)   Use inhalers on the day of surgery and bring to the hospital.  No Alcohol for 24 hours before or after surgery.  No Smoking including e-cigarettes for 24 hours before surgery.  No chewable tobacco products for at least 6 hours before surgery.  No nicotine patches on the day of surgery.  Do not use any recreational drugs for at least a week (preferably 2 weeks) before your surgery.  Please be advised that the combination of cocaine and anesthesia may have negative outcomes, up to and including death. If you test positive for cocaine, your surgery will be cancelled.  On the morning of surgery brush your teeth with toothpaste and water, you may rinse your mouth with mouthwash if you wish. Do not swallow any toothpaste or mouthwash.  Use CHG Soap or wipes as directed on instruction sheet.  Do not wear jewelry, make-up, hairpins, clips or nail  polish.  For welded (permanent) jewelry: bracelets, anklets, waist bands, etc.  Please have this removed prior to surgery.  If it is not removed, there is a chance that hospital personnel will need to cut it off on the day of surgery.  Do not wear lotions, powders, or perfumes.   Do not shave body hair from the neck down 48 hours before surgery.  Contact lenses, hearing aids and dentures may not be worn into surgery.  Do not bring valuables to the hospital. [facility] is not responsible for any missing/lost belongings or valuables.   Notify your doctor if there is any change in your medical condition (cold, fever, infection).  Wear comfortable clothing (specific to your surgery type) to the hospital.  After surgery, you can help prevent lung complications by doing breathing exercises.  Take deep breaths and cough every 1-2 hours. Your doctor may order a device called an Incentive Spirometer to help you take deep breaths.  When coughing or sneezing, hold a pillow firmly against your incision with both hands. This is called "splinting." Doing this helps protect your incision. It also decreases belly discomfort.  If you are being discharged the day of surgery, you will not be allowed to drive home. You will need a responsible individual to drive you home and stay with you for 24 hours after surgery.   If you are taking public transportation, you will need to have a responsible individual with you.  Please call the Pre-admissions Testing Dept. at [phone] if you have any questions about these instructions.  Surgery Visitation Policy:  Patients having surgery or a procedure may have two visitors.  Children under the age of 78 must have an adult with them who is not the patient.  Merchandiser, Retail to address health-related social needs:  [email or web address]                                                                                                              Preparing for Surgery with CHLORHEXIDINE  GLUCONATE (CHG) Soap  Chlorhexidine  Gluconate (CHG) Soap  o An antiseptic cleaner that kills germs and bonds with the skin to continue killing germs even after washing  o Used for showering the night before surgery and morning of surgery  Before surgery, you can play an important role by reducing the number of germs on your skin.  CHG (Chlorhexidine  gluconate) soap is an antiseptic cleanser which kills germs and bonds with the skin to continue killing germs even after washing.  Please do not use if you have an allergy to CHG or antibacterial soaps. If your skin becomes reddened/irritated stop using the CHG.  1. Shower the NIGHT BEFORE SURGERY with CHG soap.  2. If you choose to wash your hair, wash your hair first as usual with your normal shampoo.  3. After shampooing, rinse your hair and body thoroughly to remove the shampoo.  4. Use CHG as you would any other liquid soap. You can apply CHG directly to the skin and wash gently with a clean washcloth.  5. Apply the CHG soap to your body only from the neck down. Do not use on open wounds or open sores. Avoid contact with your eyes, ears, mouth, and genitals (private parts). Wash face and genitals (private parts) with your normal soap.  6. Wash thoroughly, paying special attention to the area where your surgery will be performed.  7. Thoroughly rinse your body with warm water.  8. Do not shower/wash with your normal soap after using and rinsing off the CHG soap.  9. Do not use lotions, oils, etc., after showering with CHG.  10. Pat yourself dry with a clean towel.  11. Wear clean pajamas to bed the night before surgery.  12. Place clean sheets on your bed the night of your shower and do not sleep with pets.  13. Do not apply any deodorants/lotions/powders.  14. Please wear clean clothes to the hospital.  15. Remember to brush your teeth with your regular toothpaste.

## 2024-02-05 ENCOUNTER — Other Ambulatory Visit: Payer: Self-pay

## 2024-02-05 ENCOUNTER — Ambulatory Visit: Admitting: Anesthesiology

## 2024-02-05 ENCOUNTER — Ambulatory Visit
Admission: RE | Admit: 2024-02-05 | Discharge: 2024-02-05 | Disposition: A | Attending: General Surgery | Admitting: General Surgery

## 2024-02-05 ENCOUNTER — Encounter: Admission: RE | Disposition: A | Payer: Self-pay | Attending: General Surgery

## 2024-02-05 ENCOUNTER — Encounter: Payer: Self-pay | Admitting: General Surgery

## 2024-02-05 DIAGNOSIS — Z79899 Other long term (current) drug therapy: Secondary | ICD-10-CM | POA: Diagnosis not present

## 2024-02-05 DIAGNOSIS — K4090 Unilateral inguinal hernia, without obstruction or gangrene, not specified as recurrent: Secondary | ICD-10-CM | POA: Diagnosis present

## 2024-02-05 DIAGNOSIS — Z9049 Acquired absence of other specified parts of digestive tract: Secondary | ICD-10-CM | POA: Insufficient documentation

## 2024-02-05 DIAGNOSIS — K219 Gastro-esophageal reflux disease without esophagitis: Secondary | ICD-10-CM | POA: Insufficient documentation

## 2024-02-05 HISTORY — PX: XI ROBOTIC ASSISTED INGUINAL HERNIA REPAIR WITH MESH: SHX6706

## 2024-02-05 SURGERY — REPAIR, HERNIA, INGUINAL, ROBOT-ASSISTED, LAPAROSCOPIC, USING MESH
Anesthesia: General | Site: Inguinal | Laterality: Right

## 2024-02-05 MED ORDER — CHLORHEXIDINE GLUCONATE 0.12 % MT SOLN
15.0000 mL | Freq: Once | OROMUCOSAL | Status: AC
  Administered 2024-02-05: 15 mL via OROMUCOSAL

## 2024-02-05 MED ORDER — LIDOCAINE HCL (PF) 2 % IJ SOLN
INTRAMUSCULAR | Status: DC | PRN
  Administered 2024-02-05: 80 mg via INTRADERMAL

## 2024-02-05 MED ORDER — FENTANYL CITRATE (PF) 100 MCG/2ML IJ SOLN
INTRAMUSCULAR | Status: DC | PRN
  Administered 2024-02-05 (×2): 50 ug via INTRAVENOUS

## 2024-02-05 MED ORDER — CEFAZOLIN SODIUM-DEXTROSE 2-4 GM/100ML-% IV SOLN
2.0000 g | INTRAVENOUS | Status: AC
  Administered 2024-02-05: 2 g via INTRAVENOUS

## 2024-02-05 MED ORDER — OXYCODONE HCL 5 MG/5ML PO SOLN: 5.0000 mg | Freq: Once | ORAL | Status: AC | PRN

## 2024-02-05 MED ORDER — ORAL CARE MOUTH RINSE: 15.0000 mL | Freq: Once | OROMUCOSAL | Status: AC

## 2024-02-05 MED ORDER — BUPIVACAINE-EPINEPHRINE 0.25% -1:200000 IJ SOLN
INTRAMUSCULAR | Status: DC | PRN
  Administered 2024-02-05: 20 mL
  Administered 2024-02-05: 10 mL

## 2024-02-05 MED ORDER — CHLORHEXIDINE GLUCONATE 0.12 % MT SOLN
OROMUCOSAL | Status: AC
  Filled 2024-02-05: qty 15

## 2024-02-05 MED ORDER — LIDOCAINE HCL (PF) 2 % IJ SOLN
INTRAMUSCULAR | Status: AC
  Filled 2024-02-05: qty 5

## 2024-02-05 MED ORDER — FENTANYL CITRATE (PF) 100 MCG/2ML IJ SOLN
INTRAMUSCULAR | Status: AC
  Filled 2024-02-05: qty 2

## 2024-02-05 MED ORDER — FENTANYL CITRATE (PF) 100 MCG/2ML IJ SOLN
25.0000 ug | INTRAMUSCULAR | Status: DC | PRN
  Administered 2024-02-05 (×4): 25 ug via INTRAVENOUS

## 2024-02-05 MED ORDER — PHENYLEPHRINE 80 MCG/ML (10ML) SYRINGE FOR IV PUSH (FOR BLOOD PRESSURE SUPPORT)
PREFILLED_SYRINGE | INTRAVENOUS | Status: DC | PRN
  Administered 2024-02-05: 160 ug via INTRAVENOUS
  Administered 2024-02-05: 200 ug via INTRAVENOUS

## 2024-02-05 MED ORDER — LACTATED RINGERS IV SOLN: INTRAVENOUS | Status: DC

## 2024-02-05 MED ORDER — HYDROCODONE-ACETAMINOPHEN 5-325 MG PO TABS
1.0000 | ORAL_TABLET | Freq: Four times a day (QID) | ORAL | 0 refills | Status: AC | PRN
  Filled 2024-02-05: qty 12, 3d supply, fill #0

## 2024-02-05 MED ORDER — PROPOFOL 500 MG/50ML IV EMUL
INTRAVENOUS | Status: DC | PRN
  Administered 2024-02-05: 160 mg via INTRAVENOUS
  Administered 2024-02-05: 40 mg via INTRAVENOUS

## 2024-02-05 MED ORDER — 0.9 % SODIUM CHLORIDE (POUR BTL) OPTIME
TOPICAL | Status: DC | PRN
  Administered 2024-02-05: 500 mL

## 2024-02-05 MED ORDER — DEXAMETHASONE SOD PHOSPHATE PF 10 MG/ML IJ SOLN
INTRAMUSCULAR | Status: DC | PRN
  Administered 2024-02-05: 10 mg via INTRAVENOUS

## 2024-02-05 MED ORDER — BUPIVACAINE-EPINEPHRINE (PF) 0.25% -1:200000 IJ SOLN
INTRAMUSCULAR | Status: AC
  Filled 2024-02-05: qty 30

## 2024-02-05 MED ORDER — ONDANSETRON HCL 4 MG/2ML IJ SOLN
INTRAMUSCULAR | Status: AC
  Filled 2024-02-05: qty 2

## 2024-02-05 MED ORDER — OXYCODONE HCL 5 MG PO TABS
ORAL_TABLET | ORAL | Status: AC
  Filled 2024-02-05: qty 1

## 2024-02-05 MED ORDER — ROCURONIUM BROMIDE 100 MG/10ML IV SOLN
INTRAVENOUS | Status: DC | PRN
  Administered 2024-02-05: 50 mg via INTRAVENOUS
  Administered 2024-02-05: 10 mg via INTRAVENOUS

## 2024-02-05 MED ORDER — ONDANSETRON HCL 4 MG/2ML IJ SOLN
INTRAMUSCULAR | Status: DC | PRN
  Administered 2024-02-05: 4 mg via INTRAVENOUS

## 2024-02-05 MED ORDER — DEXMEDETOMIDINE HCL IN NACL 80 MCG/20ML IV SOLN
INTRAVENOUS | Status: DC | PRN
  Administered 2024-02-05: 8 ug via INTRAVENOUS
  Administered 2024-02-05: 4 ug via INTRAVENOUS
  Administered 2024-02-05: 8 ug via INTRAVENOUS

## 2024-02-05 MED ORDER — SUGAMMADEX SODIUM 200 MG/2ML IV SOLN
INTRAVENOUS | Status: DC | PRN
  Administered 2024-02-05: 200 mg via INTRAVENOUS

## 2024-02-05 MED ORDER — MIDAZOLAM HCL (PF) 2 MG/2ML IJ SOLN
INTRAMUSCULAR | Status: DC | PRN
  Administered 2024-02-05: 2 mg via INTRAVENOUS

## 2024-02-05 MED ORDER — MIDAZOLAM HCL 2 MG/2ML IJ SOLN
INTRAMUSCULAR | Status: AC
  Filled 2024-02-05: qty 2

## 2024-02-05 MED ORDER — PROPOFOL 10 MG/ML IV BOLUS
INTRAVENOUS | Status: AC
  Filled 2024-02-05: qty 20

## 2024-02-05 MED ORDER — CEFAZOLIN SODIUM-DEXTROSE 2-4 GM/100ML-% IV SOLN
INTRAVENOUS | Status: AC
  Filled 2024-02-05: qty 100

## 2024-02-05 MED ORDER — OXYCODONE HCL 5 MG PO TABS
5.0000 mg | ORAL_TABLET | Freq: Once | ORAL | Status: AC | PRN
  Administered 2024-02-05: 5 mg via ORAL

## 2024-02-05 MED ORDER — ACETAMINOPHEN 10 MG/ML IV SOLN
INTRAVENOUS | Status: DC | PRN
  Administered 2024-02-05: 1000 mg via INTRAVENOUS

## 2024-02-05 SURGICAL SUPPLY — 33 items
BAG PRESSURE INF REUSE 1000 (BAG) IMPLANT
COVER TIP SHEARS 8 DVNC (MISCELLANEOUS) ×1 IMPLANT
COVER WAND RF STERILE (DRAPES) ×1 IMPLANT
DEFOGGER SCOPE WARM SEASHARP (MISCELLANEOUS) ×1 IMPLANT
DERMABOND ADVANCED .7 DNX12 (GAUZE/BANDAGES/DRESSINGS) ×1 IMPLANT
DRAPE ARM DVNC X/XI (DISPOSABLE) ×3 IMPLANT
DRAPE COLUMN DVNC XI (DISPOSABLE) ×1 IMPLANT
ELECTRODE REM PT RTRN 9FT ADLT (ELECTROSURGICAL) ×1 IMPLANT
FORCEPS BPLR FENES DVNC XI (FORCEP) ×1 IMPLANT
GLOVE BIO SURGEON STRL SZ 6.5 (GLOVE) ×2 IMPLANT
GLOVE BIOGEL PI IND STRL 6.5 (GLOVE) ×2 IMPLANT
GLOVE SURG SYN 6.5 PF PI (GLOVE) ×2 IMPLANT
GOWN STRL REUS W/ TWL LRG LVL3 (GOWN DISPOSABLE) ×4 IMPLANT
IRRIGATOR SUCT 8 DISP DVNC XI (IRRIGATION / IRRIGATOR) IMPLANT
IV 0.9% NACL 1000 ML (IV SOLUTION) IMPLANT
IV CATH ANGIO 12GX3 LT BLUE (NEEDLE) IMPLANT
KIT PINK PAD W/HEAD ARM REST (MISCELLANEOUS) ×1 IMPLANT
MANIFOLD NEPTUNE II (INSTRUMENTS) IMPLANT
MESH 3DMAX MID 5X7 RT XLRG (Mesh General) IMPLANT
NDL DRIVE SUT CUT DVNC (INSTRUMENTS) ×1 IMPLANT
NDL HYPO 22X1.5 SAFETY MO (MISCELLANEOUS) ×1 IMPLANT
NDL INSUFFLATION 14GA 120MM (NEEDLE) ×1 IMPLANT
NS IRRIG 500ML POUR BTL (IV SOLUTION) ×1 IMPLANT
OBTURATOR OPTICALSTD 8 DVNC (TROCAR) ×1 IMPLANT
PACK LAP CHOLECYSTECTOMY (MISCELLANEOUS) ×1 IMPLANT
SCISSORS MNPLR CVD DVNC XI (INSTRUMENTS) ×1 IMPLANT
SEAL UNIV 5-12 XI (MISCELLANEOUS) ×3 IMPLANT
SET TUBE SMOKE EVAC HIGH FLOW (TUBING) ×1 IMPLANT
SOLN STERILE WATER 500 ML (IV SOLUTION) IMPLANT
SOLUTION ELECTROSURG ANTI STCK (MISCELLANEOUS) ×1 IMPLANT
SUT STRATA 2-0 23CM CT-2 (SUTURE) ×1 IMPLANT
SUT VIC AB 2-0 SH 27XBRD (SUTURE) ×1 IMPLANT
SUTURE MNCRL 4-0 27XMF (SUTURE) ×1 IMPLANT

## 2024-02-05 NOTE — Interval H&P Note (Signed)
 History and Physical Interval Note:  02/05/2024 12:38 PM  Bryce Medina  has presented today for surgery, with the diagnosis of K40.90 non recurrent unilateral inguinal hernia w/o obstruction or gangrene.  The various methods of treatment have been discussed with the patient and family. After consideration of risks, benefits and other options for treatment, the patient has consented to  Procedure(s): REPAIR, HERNIA, INGUINAL, ROBOT-ASSISTED, LAPAROSCOPIC, USING MESH (Right) as a surgical intervention.  The patient's history has been reviewed, patient examined, no change in status, stable for surgery.  I have reviewed the patient's chart and labs.  Questions were answered to the patient's satisfaction.     Lucas Sjogren

## 2024-02-05 NOTE — Discharge Instructions (Signed)

## 2024-02-05 NOTE — Transfer of Care (Addendum)
 Immediate Anesthesia Transfer of Care Note  Patient: Bryce Medina  Procedure(s) Performed: REPAIR, HERNIA, INGUINAL, ROBOT-ASSISTED, LAPAROSCOPIC, USING MESH (Right: Inguinal)  Patient Location: PACU  Anesthesia Type:General  Level of Consciousness: drowsy and patient cooperative  Airway & Oxygen Therapy: Patient Spontanous Breathing and Patient connected to face mask oxygen  Post-op Assessment: Report given to RN and Post -op Vital signs reviewed and stable  Post vital signs: Reviewed and stable  Last Vitals:  Vitals Value Taken Time  BP 134/85 02/05/24 14:50  Temp 36.5 C 02/05/24 14:50  Pulse 90 02/05/24 14:50  Resp 18 02/05/24 14:50  SpO2 99 % 02/05/24 14:50    Last Pain:  Vitals:   02/05/24 1450  TempSrc: Tympanic  PainSc:          Complications: No notable events documented.

## 2024-02-05 NOTE — Anesthesia Postprocedure Evaluation (Signed)
 Anesthesia Post Note  Patient: TYLEE NEWBY  Procedure(s) Performed: REPAIR, HERNIA, INGUINAL, ROBOT-ASSISTED, LAPAROSCOPIC, USING MESH (Right: Inguinal)  Patient location during evaluation: PACU Anesthesia Type: General Level of consciousness: awake and alert Pain management: pain level controlled Vital Signs Assessment: post-procedure vital signs reviewed and stable Respiratory status: spontaneous breathing, nonlabored ventilation, respiratory function stable and patient connected to nasal cannula oxygen Cardiovascular status: blood pressure returned to baseline and stable Postop Assessment: no apparent nausea or vomiting Anesthetic complications: no   No notable events documented.   Last Vitals:  Vitals:   02/05/24 1556 02/05/24 1608  BP: 129/88 (!) 136/91  Pulse: 98 (!) 18  Resp: 16 (!) 21  Temp: 36.5 C 36.8 C  SpO2: 98% 98%    Last Pain:  Vitals:   02/05/24 1608  TempSrc: Temporal  PainSc: 5                  Debby Mines

## 2024-02-05 NOTE — Anesthesia Procedure Notes (Signed)
 Procedure Name: Intubation Date/Time: 02/05/2024 1:15 PM  Performed by: Lorrene Camelia LABOR, CRNAPre-anesthesia Checklist: Patient identified, Patient being monitored, Emergency Drugs available and Suction available Patient Re-evaluated:Patient Re-evaluated prior to induction Oxygen Delivery Method: Circle system utilized Preoxygenation: Pre-oxygenation with 100% oxygen Induction Type: IV induction Ventilation: Mask ventilation without difficulty Laryngoscope Size: McGrath and 4 Grade View: Grade I Tube type: Oral Tube size: 7.5 mm Number of attempts: 1 Airway Equipment and Method: Stylet Placement Confirmation: ETT inserted through vocal cords under direct vision, positive ETCO2 and breath sounds checked- equal and bilateral Secured at: 21 cm Tube secured with: Tape Dental Injury: Teeth and Oropharynx as per pre-operative assessment

## 2024-02-05 NOTE — Op Note (Signed)
 Preoperative diagnosis: Right inguinal hernia.   Postoperative diagnosis: Right inguinal hernia.  Procedure: Robotic assisted Laparoscopic Transabdominal preperitoneal laparoscopic (TAPP) repair of right inguinal hernia.  Anesthesia: GETA  Surgeon: Dr. Cesar Coe  Wound Classification: Clean  Indications:  Patient is a 58 y.o. male developed a symptomatic right inguinal hernia. Repair was indicated.  Findings: 1. Right direct Inguinal hernia identified 2. Vas deferens and cord structures identified and preserved 3. Bard Extra Large 3D Max MID Anatomical mesh used for repair 4. Adequate hemostasis.        Description of procedure:  The patient was taken to the operating room and the correct side of surgery was verified. The patient was placed supine with right arm tucked at the side. After obtaining adequate anesthesia, the patients abdomen was prepped and draped in standard sterile fashion. A time-out was completed verifying correct patient, procedure, site, positioning, and implant(s) and/or special equipment prior to beginning this procedure.  An incision was made in a natural skin line above the umbilicus. The fascia was elevated and the Veress needle inserted. Proper position was confirmed by aspiration and saline meniscus test.  The abdomen was insufflated with carbon dioxide to a pressure of 15 mmHg. The patient tolerated insufflation well.  Abdominal cavity was entered using Optiview technique with a millimeter trocar.  No injury was identified.  Another 2 mm trocars were placed lateral to each rectus muscle.  Scissors and bipolar forceps were inserted under direct visualization. At the robotic console: Transverse peritoneal incision is made about 8 cm superior to the inguinal defect. Medial to the epigastric vessels, the parietal compartment is dissected to visualize the rectus muscle. This is carried down to the symphysis pubis and the retropubic space is dissected to expose  at least 2 cm contralateral to the midline. Coopers ligament is exposed and cleared at least 2 cm below the ligament to ensure adequate space for the inferior border of the mesh. Hesselbachs triangle is cleared assessing for a direct hernia. The hernia is reduced dissecting the contents away from the border of the transversalis (white) fascia. The pseudosac was imbricated onto Coopers ligament in lieau of closure of the direct defect to prevent injury to the anterior nerves and cord structures. Lateral to the epigastric vessels, the dissection is carried out in visceral compartment continuing in the true preperitoneal plane. This dissection was continued until the cord structures are parietalized completely, allowing for visualization of the reflected peritoneum that is continuous with the line originating 2 cm below Coopers medially and across the psoas muscle in the lateral compartment.  The internal ring was interrogated for a cord lipoma. The cord lipoma was reduced to the retroperitoneum and seated dorsal to the preperitoneal mesh. Having achieved a complete dissection with a critical view of the entire myopectineal orifice, an XL mesh was then positioned centered at the iliopubic tract with the medial side crossing the midline and the inferior edge positioned 2 cm below Coopers ligament. The lateral aspect of the mesh extended 3-5 cm beyond the lateral edge of the psoas. The mesh is fixated using an interrupted suture placed to the ipsilateral Coopers ligament. A second suture was done at the medial superior aspect of the mesh fixating this to the rectus complex.  The peritoneal flap is closed with running barbed suture. Additional holes in the peritoneum closed with suture. Preperitoneal space gas aspirated to visualize the peritoneum apposed directly against the mesh and ensure no folding, lifting, or buckling of the mesh. Skin is  closed, sterile dressings are applied.  The patient tolerated the  procedure well and was taken to the postanesthesia care unit in stable condition  Specimen: None  Complications: None  Estimated Blood Loss: 5 mL
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
## 2024-02-06 ENCOUNTER — Encounter: Payer: Self-pay | Admitting: General Surgery
